# Patient Record
Sex: Female | Born: 2005 | Race: Black or African American | Hispanic: No | Marital: Single | Smoking: Former smoker
Health system: Southern US, Community
[De-identification: ages and names within clinical notes are randomized; demographics above are authoritative.]

## PROBLEM LIST (undated history)

## (undated) DIAGNOSIS — A048 Other specified bacterial intestinal infections: Secondary | ICD-10-CM

## (undated) DIAGNOSIS — R112 Nausea with vomiting, unspecified: Secondary | ICD-10-CM

## (undated) HISTORY — PX: CHOLECYSTECTOMY: SHX55

---
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
## 2023-01-09 ENCOUNTER — Ambulatory Visit: Payer: Medicaid Other | Admitting: Student

## 2023-01-09 ENCOUNTER — Emergency Department (HOSPITAL_COMMUNITY)
Admission: EM | Admit: 2023-01-09 | Discharge: 2023-01-09 | Disposition: A | Discharge status: Home / Self Care | Payer: Medicaid Other | Attending: Emergency Medicine | Admitting: Emergency Medicine

## 2023-01-09 DIAGNOSIS — R112 Nausea with vomiting, unspecified: Secondary | ICD-10-CM | POA: Diagnosis not present

## 2023-01-09 DIAGNOSIS — R1110 Vomiting, unspecified: Secondary | ICD-10-CM | POA: Diagnosis present

## 2023-01-09 DIAGNOSIS — R1013 Epigastric pain: Secondary | ICD-10-CM | POA: Insufficient documentation

## 2023-01-09 LAB — COMPREHENSIVE METABOLIC PANEL
ALT: 13 U/L (ref 0–44)
AST: 20 U/L (ref 15–41)
Albumin: 4.8 g/dL (ref 3.5–5.0)
Alkaline Phosphatase: 53 U/L (ref 38–126)
Anion gap: 14 (ref 5–15)
BUN: 6 mg/dL (ref 6–20)
CO2: 22 mmol/L (ref 22–32)
Calcium: 9.5 mg/dL (ref 8.9–10.3)
Chloride: 103 mmol/L (ref 98–111)
Creatinine, Ser: 0.58 mg/dL (ref 0.44–1.00)
GFR, Estimated: >60 mL/min (ref >60)
Glucose, Bld: 134 mg/dL — ABNORMAL HIGH (ref 70–99)
Potassium: 3.4 mmol/L — ABNORMAL LOW (ref 3.5–5.1)
Sodium: 139 mmol/L (ref 135–145)
Total Bilirubin: 0.6 mg/dL (ref 0.3–1.2)
Total Protein: 8.6 g/dL — ABNORMAL HIGH (ref 6.5–8.1)

## 2023-01-09 LAB — LIPASE, BLOOD: Lipase: 20 U/L (ref 11–51)

## 2023-01-09 LAB — PREGNANCY, URINE: Preg Test, Ur: NEGATIVE

## 2023-01-09 LAB — CBC WITH DIFFERENTIAL/PLATELET
Abs Immature Granulocytes: 0.03 10*3/uL (ref 0.00–0.07)
Basophils Absolute: 0 10*3/uL (ref 0.0–0.1)
Basophils Relative: 0 %
Eosinophils Absolute: 0 10*3/uL (ref 0.0–0.5)
Eosinophils Relative: 0 %
HCT: 44.1 % (ref 36.0–46.0)
Hemoglobin: 14.4 g/dL (ref 12.0–15.0)
Immature Granulocytes: 0 %
Lymphocytes Relative: 7 %
Lymphs Abs: 0.8 10*3/uL (ref 0.7–4.0)
MCH: 30.3 pg (ref 26.0–34.0)
MCHC: 32.7 g/dL (ref 30.0–36.0)
MCV: 92.8 fL (ref 80.0–100.0)
Monocytes Absolute: 0.4 10*3/uL (ref 0.1–1.0)
Monocytes Relative: 4 %
Neutro Abs: 9.5 10*3/uL — ABNORMAL HIGH (ref 1.7–7.7)
Neutrophils Relative %: 89 %
Platelets: 419 10*3/uL — ABNORMAL HIGH (ref 150–400)
RBC: 4.75 MIL/uL (ref 3.87–5.11)
RDW: 15.9 % — ABNORMAL HIGH (ref 11.5–15.5)
WBC: 10.8 10*3/uL — ABNORMAL HIGH (ref 4.0–10.5)
nRBC: 0 % (ref 0.0–0.2)

## 2023-01-09 LAB — URINALYSIS, ROUTINE W REFLEX MICROSCOPIC
Bilirubin Urine: NEGATIVE
Glucose, UA: NEGATIVE mg/dL
Hgb urine dipstick: NEGATIVE
Ketones, ur: 80 mg/dL — AB
Leukocytes,Ua: NEGATIVE
Nitrite: NEGATIVE
Protein, ur: 100 mg/dL — AB
Specific Gravity, Urine: 1.023 (ref 1.005–1.030)
pH: 8 (ref 5.0–8.0)

## 2023-01-09 MED ORDER — ONDANSETRON 4 MG PO TBDP: 4.0000 mg | ORAL_TABLET | Freq: Three times a day (TID) | ORAL | 0 refills | Status: DC | PRN

## 2023-01-09 MED ORDER — ONDANSETRON HCL 4 MG/2ML IJ SOLN
4.0000 mg | Freq: Once | INTRAMUSCULAR | Status: AC
  Administered 2023-01-09: 4 mg via INTRAVENOUS
  Filled 2023-01-09: qty 2

## 2023-01-09 MED ORDER — ONDANSETRON 4 MG PO TBDP: 4.0000 mg | ORAL_TABLET | Freq: Once | ORAL | Status: DC

## 2023-01-09 MED ORDER — LACTATED RINGERS IV BOLUS
1000.0000 mL | Freq: Once | INTRAVENOUS | Status: AC
  Administered 2023-01-09: 1000 mL via INTRAVENOUS

## 2023-01-09 MED ORDER — DROPERIDOL 2.5 MG/ML IJ SOLN
1.2500 mg | Freq: Once | INTRAMUSCULAR | Status: AC
  Administered 2023-01-09: 1.25 mg via INTRAVENOUS
  Filled 2023-01-09: qty 2

## 2023-01-09 MED ORDER — DROPERIDOL 2.5 MG/ML IJ SOLN: 1.2500 mg | Freq: Once | INTRAMUSCULAR | Status: DC

## 2023-01-09 MED ORDER — FENTANYL CITRATE PF 50 MCG/ML IJ SOSY
25.0000 ug | PREFILLED_SYRINGE | Freq: Once | INTRAMUSCULAR | Status: AC
  Administered 2023-01-09: 25 ug via INTRAVENOUS
  Filled 2023-01-09: qty 1

## 2023-01-09 NOTE — ED Provider Triage Note (Signed)
Emergency Medicine Provider Triage Evaluation Note  Najma RYANNAH BROOMELL , a 17 y.o. adult  was evaluated in triage.  Pt complains of vomiting  Review of Systems  Positive: Vomiting and abdominal pain  Negative: fever  Physical Exam  BP (!) 153/114 (BP Location: Right Arm)   Pulse (!) 115   Temp 98.3 F (36.8 C) (Oral)   Resp 16   Ht 5\' 3"  (1.6 m)   Wt 47.6 kg   LMP 12/10/2022   SpO2 99%   BMI 18.60 kg/m  Gen:   Awake, vomiting Resp:  Normal effort  MSK:   Moves extremities without difficulty  Other:    Medical Decision Making  Medically screening exam initiated at 12:22 PM.  Appropriate orders placed.  Jari E Kennemer was informed that the remainder of the evaluation will be completed by another provider, this initial triage assessment does not replace that evaluation, and the importance of remaining in the ED until their evaluation is complete.     Elson Areas, [place] 01/09/23 1223

## 2023-01-09 NOTE — Discharge Instructions (Addendum)
You are seen here in the emergency department for concerns of nausea and vomiting.  You had a positive response to medications here in the ER and appear stable for discharge. I have sent a prescription for Zofran to your pharmacy for continued nausea control at home. If your symptoms worsen, please return to the ER.

## 2023-01-09 NOTE — Progress Notes (Deleted)
    SUBJECTIVE:   CHIEF COMPLAINT / HPI:   ***  PERTINENT  PMH / PSH: ***  OBJECTIVE:   LMP 12/10/2022  ***  General: NAD, pleasant, able to participate in exam Cardiac: RRR, no murmurs. Respiratory: CTAB, normal effort, No wheezes, rales or rhonchi Abdomen: Bowel sounds present, nontender, nondistended, no hepatosplenomegaly. Extremities: no edema or cyanosis. Skin: warm and dry, no rashes noted Neuro: alert, no obvious focal deficits Psych: Normal affect and mood  ASSESSMENT/PLAN:   No problem-specific Assessment & Plan notes found for this encounter.     Dr. [name], DO [facility]    {    This will disappear when note is signed, click to select method of visit    :1}

## 2023-01-09 NOTE — ED Triage Notes (Signed)
Pt arrived POV reporting abdominal pain n/v x2days. Seen with similar symptoms in the past. NO other symptoms

## 2023-01-09 NOTE — ED Notes (Signed)
Unable to get urine sample

## 2023-01-09 NOTE — ED Provider Notes (Signed)
[place] EMERGENCY DEPARTMENT AT [facility] Provider Note   CSN: [id number] Arrival date & time: 01/09/23  1137     History Chief Complaint  Patient presents with   Emesis    Theresa Burnett is a 17 y.o. adult.  Patient without significant past medical history presents the emergency department complaints abdominal pain and vomiting.  Reports has been ongoing for approximately 2 days.  Has been seeing in the past in the emergency department similar symptoms.  Denies any other concerns such as diarrhea, chest pain, shortness of breath, headache, fevers.  No urinary concerns at this time.  Denies any chance of pregnancy.  Denies any drug or substance use.  Was last seen on 12/24/2022 cannabinol hyperemesis syndrome.  HPI     Home Medications Prior to Admission medications   Medication Sig Start Date End Date Taking? Authorizing Provider  ondansetron (ZOFRAN-ODT) 4 MG disintegrating tablet Take 1 tablet (4 mg total) by mouth every 8 (eight) hours as needed for nausea or vomiting. 01/09/23  Yes Jw Covin A, PA-C  calcium carbonate (TUMS - DOSED IN MG ELEMENTAL CALCIUM) 500 MG chewable tablet Chew 5 tablets (1,000 mg of elemental calcium total) by mouth 2 (two) times daily. Patient not taking: Reported on 12/14/2022 05/03/22   [name], DO  dicyclomine (BENTYL) 20 MG tablet Take 1 tablet (20 mg total) by mouth 2 (two) times daily. Patient not taking: Reported on 12/14/2022 07/25/22   Prosperi, Christian H, PA-C  metoCLOPramide (REGLAN) 10 MG tablet Take 1 tablet (10 mg total) by mouth every 8 (eight) hours as needed for nausea. Patient not taking: Reported on 12/14/2022 04/27/22 04/27/23  Minna Antis, MD  mirtazapine (REMERON) 7.5 MG tablet TAKE 1 TABLET BY MOUTH AT BEDTIME. Patient not taking: Reported on 12/14/2022 05/29/22   [name], DO  Multiple Vitamin (MULTIVITAMIN WITH MINERALS) TABS tablet Take 1 tablet by mouth daily. 12/14/22   [name], DO  ondansetron  (ZOFRAN) 4 MG tablet Take 1 tablet (4 mg total) by mouth every 6 (six) hours as needed for nausea or vomiting. 12/24/22   Al Decant, PA-C  ondansetron (ZOFRAN-ODT) 4 MG disintegrating tablet Take 1 tablet (4 mg total) by mouth every 8 (eight) hours as needed for nausea or vomiting. 12/14/22   [name], DO  pantoprazole (PROTONIX) 40 MG tablet TAKE 1 TABLET BY MOUTH DAILY AS NEEDED 12/21/22   [name], DO  potassium chloride SA (KLOR-CON M) 20 MEQ tablet Take 1 tablet (20 mEq total) by mouth 2 (two) times daily. 12/24/22   Al Decant, PA-C  promethazine (PHENERGAN) 25 MG suppository Place 1 suppository (25 mg total) rectally every 6 (six) hours as needed for nausea or vomiting. 12/14/22   [name], DO  sucralfate (CARAFATE) 1 g tablet Take 1 tablet (1 g total) by mouth 4 (four) times daily. Patient not taking: Reported on 12/14/2022 07/24/22   Lorre Nick, MD      Allergies    Patient has no known allergies.    Review of Systems   Review of Systems  Gastrointestinal:  Positive for nausea and vomiting.  All other systems reviewed and are negative.   Physical Exam Updated Vital Signs BP (!) 162/98 (BP Location: Left Arm)   Pulse 71   Temp 97.7 F (36.5 C) (Oral)   Resp 18   Ht 5\' 3"  (1.6 m)   Wt 47.6 kg   LMP 12/10/2022   SpO2 100%   BMI 18.60 kg/m  Physical Exam Vitals and nursing note reviewed.  Constitutional:      General: Theresa Burnett is not in acute distress.    Appearance: Theresa Burnett is well-developed.  HENT:     Head: Normocephalic and atraumatic.  Eyes:     Conjunctiva/sclera: Conjunctivae normal.  Cardiovascular:     Rate and Rhythm: Normal rate and regular rhythm.     Heart sounds: No murmur heard. Pulmonary:     Effort: Pulmonary effort is normal. No respiratory distress.     Breath sounds: Normal breath sounds.  Abdominal:     General: There is no distension.     Palpations: Abdomen is soft. There is no mass.     Tenderness: There is  abdominal tenderness in the epigastric area.  Musculoskeletal:        General: No swelling.     Cervical back: Neck supple.  Skin:    General: Skin is warm and dry.     Capillary Refill: Capillary refill takes less than 2 seconds.  Neurological:     Mental Status: Theresa Burnett is alert.  Psychiatric:        Mood and Affect: Mood normal.     ED Results / Procedures / Treatments   Labs (all labs ordered are listed, but only abnormal results are displayed) Labs Reviewed  CBC WITH DIFFERENTIAL/PLATELET - Abnormal; Notable for the following components:      Result Value   WBC 10.8 (*)    RDW 15.9 (*)    Platelets 419 (*)    Neutro Abs 9.5 (*)    All other components within normal limits  COMPREHENSIVE METABOLIC PANEL - Abnormal; Notable for the following components:   Potassium 3.4 (*)    Glucose, Bld 134 (*)    Total Protein 8.6 (*)    All other components within normal limits  LIPASE, BLOOD  URINALYSIS, ROUTINE W REFLEX MICROSCOPIC  PREGNANCY, URINE    EKG None  Radiology No results found.  Procedures Procedures   Medications Ordered in ED Medications  lactated ringers bolus 1,000 mL (0 mLs Intravenous Stopped 01/09/23 1634)  ondansetron (ZOFRAN) injection 4 mg (4 mg Intravenous Given 01/09/23 1439)  fentaNYL (SUBLIMAZE) injection 25 mcg (25 mcg Intravenous Given 01/09/23 1440)  droperidol (INAPSINE) 2.5 MG/ML injection 1.25 mg (1.25 mg Intravenous Given 01/09/23 1603)    ED Course/ Medical Decision Making/ A&P                               Medical Decision Making Risk Prescription drug management.   This patient presents to the ED for concern of abdominal pain, emesis.  Differential diagnosis includes cannabinoid hyperemesis syndrome, gastroenteritis, bowel obstruction, pancreatitis, pyelonephritis   Lab Tests:  I Ordered, and personally interpreted labs.  The pertinent results include: Mild leukocytosis of 10.8, platelets elevated at 419 which is somewhat now  patient's baseline compared to prior labs possibly secondary to dehydration, CMP with mild hypokalemia at 3.4 likely due to GI loss, lipase normal at 20, no evidence of an anion gap acidosis   Medicines ordered and prescription drug management:  I ordered medication including fluids, fentanyl, Zofran, droperidol for irritation, pain, nausea, cannabinoid induced vomiting Reevaluation of the patient after these medicines showed that the patient was I have reviewed the patients home medicines and have made adjustments as needed   Problem List / ED Course:  Patient with past history significant for cyclic vomiting syndrome, 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  States that this is been ongoing for the last 2 days.  Denies any improvement in symptoms.  No concern for pregnancy at this time.  No other concerning symptoms such as diarrhea, chest pain, shortness of breath, headache or dizziness.  Basic labs ordered from triage for evaluation of patient's symptoms including signs of infection or dehydration. CBC with mild leukocytosis at 10.8 and mild thrombocytosis at 419.  CMP without any obvious electrolyte derangements potassium slightly decreased at 3.4 better than last visit in the ED.  Lipase is 28 and negative, UA and urine pregnancy pending. Patient denies any urinary symptoms or chance of pregnancy. Patient tried a course of fentanyl and Zofran with only minimal improvement in symptoms.  No significant improvement in symptoms with this.  Although patient reports no recent cannabis use, droperidol attempted for improvement in symptoms. Patient medically significant improvement in symptoms after droperidol administration.  Will plan on discharging patient home. Discussed return precautions.  Patient otherwise stable for discharge home.   Final Clinical  Impression(s) / ED Diagnoses Final diagnoses:  Nausea and vomiting, unspecified vomiting type    Rx / DC Orders ED Discharge Orders          Ordered    ondansetron (ZOFRAN-ODT) 4 MG disintegrating tablet  Every 8 hours PRN        01/09/23 1738              Smitty Knudsen, PA-C 01/09/23 1741    Linwood Dibbles, MD 01/10/23 1350

## 2023-01-15 ENCOUNTER — Emergency Department (HOSPITAL_COMMUNITY)
Admission: EM | Admit: 2023-01-15 | Discharge: 2023-01-15 | Discharge status: Against Medical Advice | Payer: Medicaid Other | Attending: Emergency Medicine | Admitting: Emergency Medicine

## 2023-01-15 DIAGNOSIS — R112 Nausea with vomiting, unspecified: Secondary | ICD-10-CM | POA: Insufficient documentation

## 2023-01-15 DIAGNOSIS — Z5321 Procedure and treatment not carried out due to patient leaving prior to being seen by health care provider: Secondary | ICD-10-CM | POA: Insufficient documentation

## 2023-01-15 LAB — COMPREHENSIVE METABOLIC PANEL
ALT: 13 U/L (ref 0–44)
AST: 19 U/L (ref 15–41)
Albumin: 4.6 g/dL (ref 3.5–5.0)
Alkaline Phosphatase: 52 U/L (ref 38–126)
Anion gap: 14 (ref 5–15)
BUN: 7 mg/dL (ref 6–20)
CO2: 29 mmol/L (ref 22–32)
Calcium: 9.7 mg/dL (ref 8.9–10.3)
Chloride: 93 mmol/L — ABNORMAL LOW (ref 98–111)
Creatinine, Ser: 0.66 mg/dL (ref 0.44–1.00)
GFR, Estimated: >60 mL/min (ref >60)
Glucose, Bld: 106 mg/dL — ABNORMAL HIGH (ref 70–99)
Potassium: 2.5 mmol/L — CL (ref 3.5–5.1)
Sodium: 136 mmol/L (ref 135–145)
Total Bilirubin: 1.2 mg/dL (ref 0.3–1.2)
Total Protein: 8.5 g/dL — ABNORMAL HIGH (ref 6.5–8.1)

## 2023-01-15 LAB — CBC
HCT: 44.2 % (ref 36.0–46.0)
Hemoglobin: 15.1 g/dL — ABNORMAL HIGH (ref 12.0–15.0)
MCH: 30.6 pg (ref 26.0–34.0)
MCHC: 34.2 g/dL (ref 30.0–36.0)
MCV: 89.5 fL (ref 80.0–100.0)
Platelets: 278 10*3/uL (ref 150–400)
RBC: 4.94 MIL/uL (ref 3.87–5.11)
RDW: 15.3 % (ref 11.5–15.5)
WBC: 7.5 10*3/uL (ref 4.0–10.5)
nRBC: 0 % (ref 0.0–0.2)

## 2023-01-15 LAB — HCG, SERUM, QUALITATIVE: Preg, Serum: NEGATIVE

## 2023-01-15 LAB — LIPASE, BLOOD: Lipase: 23 U/L (ref 11–51)

## 2023-01-15 NOTE — ED Triage Notes (Signed)
Pt BIBA from home for NV x4 days, getting worse the last few days. Hx of same. Liquidy emesis.   138/92 HR 100 99% RA CBG 158

## 2023-01-15 NOTE — ED Notes (Signed)
Pt not in waiting room pt phone called and she did not answer. Pt eloped

## 2023-02-18 ENCOUNTER — Emergency Department (HOSPITAL_COMMUNITY)
Admission: EM | Admit: 2023-02-18 | Discharge: 2023-02-18 | Disposition: A | Discharge status: Home / Self Care | Payer: Medicaid Other | Attending: Emergency Medicine | Admitting: Emergency Medicine

## 2023-02-18 ENCOUNTER — Emergency Department (HOSPITAL_COMMUNITY): Payer: Medicaid Other

## 2023-02-18 ENCOUNTER — Encounter (HOSPITAL_COMMUNITY): Payer: Self-pay

## 2023-02-18 DIAGNOSIS — R1084 Generalized abdominal pain: Secondary | ICD-10-CM

## 2023-02-18 DIAGNOSIS — N8301 Follicular cyst of right ovary: Secondary | ICD-10-CM | POA: Diagnosis not present

## 2023-02-18 DIAGNOSIS — N83201 Unspecified ovarian cyst, right side: Secondary | ICD-10-CM

## 2023-02-18 LAB — COMPREHENSIVE METABOLIC PANEL
ALT: 15 U/L (ref 0–44)
AST: 23 U/L (ref 15–41)
Albumin: 5 g/dL (ref 3.5–5.0)
Alkaline Phosphatase: 58 U/L (ref 38–126)
Anion gap: 11 (ref 5–15)
BUN: 6 mg/dL (ref 6–20)
CO2: 21 mmol/L — ABNORMAL LOW (ref 22–32)
Calcium: 9.8 mg/dL (ref 8.9–10.3)
Chloride: 105 mmol/L (ref 98–111)
Creatinine, Ser: 0.58 mg/dL (ref 0.44–1.00)
GFR, Estimated: >60 mL/min (ref >60)
Glucose, Bld: 119 mg/dL — ABNORMAL HIGH (ref 70–99)
Potassium: 3.3 mmol/L — ABNORMAL LOW (ref 3.5–5.1)
Sodium: 137 mmol/L (ref 135–145)
Total Bilirubin: 0.9 mg/dL (ref 0.3–1.2)
Total Protein: 9 g/dL — ABNORMAL HIGH (ref 6.5–8.1)

## 2023-02-18 LAB — CBC WITH DIFFERENTIAL/PLATELET
Abs Immature Granulocytes: 0.03 10*3/uL (ref 0.00–0.07)
Basophils Absolute: 0 10*3/uL (ref 0.0–0.1)
Basophils Relative: 0 %
Eosinophils Absolute: 0 10*3/uL (ref 0.0–0.5)
Eosinophils Relative: 0 %
HCT: 46.1 % — ABNORMAL HIGH (ref 36.0–46.0)
Hemoglobin: 15.3 g/dL — ABNORMAL HIGH (ref 12.0–15.0)
Immature Granulocytes: 0 %
Lymphocytes Relative: 14 %
Lymphs Abs: 1.3 10*3/uL (ref 0.7–4.0)
MCH: 30.8 pg (ref 26.0–34.0)
MCHC: 33.2 g/dL (ref 30.0–36.0)
MCV: 92.9 fL (ref 80.0–100.0)
Monocytes Absolute: 0.4 10*3/uL (ref 0.1–1.0)
Monocytes Relative: 4 %
Neutro Abs: 7.6 10*3/uL (ref 1.7–7.7)
Neutrophils Relative %: 82 %
Platelets: 393 10*3/uL (ref 150–400)
RBC: 4.96 MIL/uL (ref 3.87–5.11)
RDW: 15.9 % — ABNORMAL HIGH (ref 11.5–15.5)
WBC: 9.3 10*3/uL (ref 4.0–10.5)
nRBC: 0 % (ref 0.0–0.2)

## 2023-02-18 LAB — LIPASE, BLOOD: Lipase: 26 U/L (ref 11–51)

## 2023-02-18 LAB — HCG, SERUM, QUALITATIVE: Preg, Serum: NEGATIVE

## 2023-02-18 MED ORDER — OXYCODONE HCL 5 MG PO TABS: 5.0000 mg | ORAL_TABLET | Freq: Four times a day (QID) | ORAL | 0 refills | Status: AC | PRN

## 2023-02-18 MED ORDER — IOHEXOL 300 MG/ML  SOLN
100.0000 mL | Freq: Once | INTRAMUSCULAR | Status: AC | PRN
  Administered 2023-02-18: 100 mL via INTRAVENOUS

## 2023-02-18 MED ORDER — ACETAMINOPHEN 325 MG PO TABS
650.0000 mg | ORAL_TABLET | Freq: Once | ORAL | Status: DC
  Filled 2023-02-18: qty 2

## 2023-02-18 MED ORDER — ONDANSETRON HCL 4 MG/2ML IJ SOLN
4.0000 mg | Freq: Once | INTRAMUSCULAR | Status: AC
  Administered 2023-02-18: 4 mg via INTRAVENOUS
  Filled 2023-02-18: qty 2

## 2023-02-18 MED ORDER — METOCLOPRAMIDE HCL 5 MG/ML IJ SOLN
10.0000 mg | Freq: Once | INTRAMUSCULAR | Status: AC
  Administered 2023-02-18: 10 mg via INTRAVENOUS
  Filled 2023-02-18: qty 2

## 2023-02-18 MED ORDER — ONDANSETRON 4 MG PO TBDP: 4.0000 mg | ORAL_TABLET | Freq: Three times a day (TID) | ORAL | 0 refills | Status: DC | PRN

## 2023-02-18 MED ORDER — MORPHINE SULFATE (PF) 4 MG/ML IV SOLN
4.0000 mg | Freq: Once | INTRAVENOUS | Status: AC
  Administered 2023-02-18: 4 mg via INTRAVENOUS
  Filled 2023-02-18: qty 1

## 2023-02-18 NOTE — Discharge Instructions (Signed)
Thank you for coming to [facility] Emergency Department. You were seen for abdominal pain. We did an exam, labs, and imaging, and these showed a small right ovarian cyst with some free fluid in the pelvis. It is possible that you had a cyst rupture today. You can alternate taking Tylenol and ibuprofen as needed for pain. You can take 650mg  tylenol (acetaminophen) every 4-6 hours, and 600 mg ibuprofen 3 times a day. Please follow up with your gynecologist in 1-2 weeks.    Do not hesitate to return to the ED or call 911 if you experience: -Worsening symptoms -Lightheadedness, passing out -Fevers/chills -Anything else that concerns you

## 2023-02-18 NOTE — ED Provider Notes (Signed)
[place] EMERGENCY DEPARTMENT AT [facility] Provider Note   CSN: [id number] Arrival date & time: 02/18/23  1112     History  Chief Complaint  Patient presents with   Abdominal Pain    Theresa Burnett is a 17 y.o. adult.  HPI   17 year old female presents emergency department with concern for mid abdominal pain.  This has been going on for the past 2 days.  History primarily obtained from the mother at bedside, patient is uncomfortable in the bed but is not participating in history giving.  Mother states that the abdominal pain started about 2 days ago, is mid abdominal, cramping and sharp.  The pain is so severe that this is what causes the nausea/vomiting.  No noted fever, diarrhea, genitourinary symptoms.  Last menstrual period was last week and reported to be normal.  Home Medications Prior to Admission medications   Medication Sig Start Date End Date Taking? Authorizing Provider  calcium carbonate (TUMS - DOSED IN MG ELEMENTAL CALCIUM) 500 MG chewable tablet Chew 5 tablets (1,000 mg of elemental calcium total) by mouth 2 (two) times daily. Patient not taking: Reported on 12/14/2022 05/03/22   [name], DO  dicyclomine (BENTYL) 20 MG tablet Take 1 tablet (20 mg total) by mouth 2 (two) times daily. Patient not taking: Reported on 12/14/2022 07/25/22   Prosperi, Christian H, PA-C  metoCLOPramide (REGLAN) 10 MG tablet Take 1 tablet (10 mg total) by mouth every 8 (eight) hours as needed for nausea. Patient not taking: Reported on 12/14/2022 04/27/22 04/27/23  Minna Antis, MD  mirtazapine (REMERON) 7.5 MG tablet TAKE 1 TABLET BY MOUTH AT BEDTIME. Patient not taking: Reported on 12/14/2022 05/29/22   [name], DO  Multiple Vitamin (MULTIVITAMIN WITH MINERALS) TABS tablet Take 1 tablet by mouth daily. 12/14/22   [name], DO  ondansetron (ZOFRAN) 4 MG tablet Take 1 tablet (4 mg total) by mouth every 6 (six) hours as needed for nausea or vomiting. 12/24/22   Al Decant, PA-C  ondansetron (ZOFRAN-ODT) 4 MG disintegrating tablet Take 1 tablet (4 mg total) by mouth every 8 (eight) hours as needed for nausea or vomiting. 12/14/22   [name], DO  ondansetron (ZOFRAN-ODT) 4 MG disintegrating tablet Take 1 tablet (4 mg total) by mouth every 8 (eight) hours as needed for nausea or vomiting. 01/09/23   Smitty Knudsen, PA-C  pantoprazole (PROTONIX) 40 MG tablet TAKE 1 TABLET BY MOUTH DAILY AS NEEDED 12/21/22   [name], DO  potassium chloride SA (KLOR-CON M) 20 MEQ tablet Take 1 tablet (20 mEq total) by mouth 2 (two) times daily. 12/24/22   Al Decant, PA-C  promethazine (PHENERGAN) 25 MG suppository Place 1 suppository (25 mg total) rectally every 6 (six) hours as needed for nausea or vomiting. 12/14/22   [name], DO  sucralfate (CARAFATE) 1 g tablet Take 1 tablet (1 g total) by mouth 4 (four) times daily. Patient not taking: Reported on 12/14/2022 07/24/22   Lorre Nick, MD      Allergies    Patient has no known allergies.    Review of Systems   Review of Systems  Constitutional:  Negative for fever.  Respiratory:  Negative for shortness of breath.   Cardiovascular:  Negative for chest pain.  Gastrointestinal:  Positive for abdominal pain, nausea and vomiting. Negative for diarrhea.  Genitourinary:  Negative for difficulty urinating, menstrual problem and pelvic pain.  Skin:  Negative for rash.  Neurological:  Negative for  headaches.    Physical Exam Updated Vital Signs BP (!) 150/96   Pulse 88   Temp 98.1 F (36.7 C) (Oral)   Resp 17   LMP 02/16/2023   SpO2 98%  Physical Exam Vitals and nursing note reviewed.  Constitutional:      General: Theresa Burnett is not in acute distress.    Appearance: Normal appearance.  HENT:     Head: Normocephalic.     Mouth/Throat:     Mouth: Mucous membranes are moist.  Cardiovascular:     Rate and Rhythm: Normal rate.  Pulmonary:     Effort: Pulmonary effort is normal. No respiratory  distress.  Abdominal:     General: Bowel sounds are normal. There is no distension.     Palpations: Abdomen is soft.     Tenderness: There is abdominal tenderness. There is no guarding or rebound.  Skin:    General: Skin is warm.  Neurological:     Mental Status: Theresa Burnett is alert and oriented to person, place, and time. Mental status is at baseline.  Psychiatric:        Mood and Affect: Mood normal.     ED Results / Procedures / Treatments   Labs (all labs ordered are listed, but only abnormal results are displayed) Labs Reviewed  CBC WITH DIFFERENTIAL/PLATELET - Abnormal; Notable for the following components:      Result Value   Hemoglobin 15.3 (*)    HCT 46.1 (*)    RDW 15.9 (*)    All other components within normal limits  COMPREHENSIVE METABOLIC PANEL - Abnormal; Notable for the following components:   Potassium 3.3 (*)    CO2 21 (*)    Glucose, Bld 119 (*)    Total Protein 9.0 (*)    All other components within normal limits  LIPASE, BLOOD  HCG, SERUM, QUALITATIVE    EKG None  Radiology No results found.  Procedures Procedures    Medications Ordered in ED Medications  acetaminophen (TYLENOL) tablet 650 mg (650 mg Oral Patient Refused/Not Given 02/18/23 1331)  morphine (PF) 4 MG/ML injection 4 mg (4 mg Intravenous Given 02/18/23 1345)  ondansetron (ZOFRAN) injection 4 mg (4 mg Intravenous Given 02/18/23 1345)    ED Course/ Medical Decision Making/ A&P                                 Medical Decision Making Amount and/or Complexity of Data Reviewed Labs: ordered. Radiology: ordered.  Risk OTC drugs. Prescription drug management.   17 year old female presents emergency department abdominal pain for 2 days, associated with nausea/vomiting.  They are denying any genitourinary symptoms, recent menstrual period was last week and normal for her.  Afebrile on arrival, uncomfortable and moaning in the bed with a tender abdomen on exam but not distended or  peritonitic.  Primary history obtained from the mother at bedside because the patient is not fully participating at this time.  Pregnancy test is negative.  Blood work is reassuring, IV medications ordered.  Plan for CT scan of the abdomen given the discomfort on exam.  Patient signed out pending CT imaging.        Final Clinical Impression(s) / ED Diagnoses Final diagnoses:  None    Rx / DC Orders ED Discharge Orders     None         Rozelle Logan, DO 02/18/23 1530

## 2023-02-18 NOTE — ED Provider Notes (Signed)
7:19 PM Assumed care of patient from off-going team. For more details, please see note from same day.  In brief, this is a 17 y.o. adult p/w abdominal pain, cramping/sharp x 2 days. So severe results in vomiting. No f/c, GU sxs, diarrhea/constipation.   Plan/Dispo at time of sign-out & ED Course since sign-out: [ ]  CT  BP (!) 150/96   Pulse 88   Temp 98.5 F (36.9 C) (Oral)   Resp 17   LMP 02/16/2023   SpO2 98%    ED Course:   Clinical Course as of 02/18/23 1919  Sun Feb 18, 2023  1621 CT ABDOMEN PELVIS W CONTRAST 1. No acute findings in the abdomen pelvis. 2. Normal appendix. 3. Postcholecystectomy. 4. Low-density cyst of the RIGHT ovary. Small volume of free fluid in the posterior cul-de-sac. Favor benign functional ovarian cyst or hemorrhagic cyst. No follow-up recommended unless pelvic pain.   [HN]  1622 Ordered US torsion r/o.  [HN]  1748 US PELVIS (TRANSABDOMINAL ONLY) 1. Simple, benign right ovarian cyst or follicle measuring 2.5 x 2.4 x 2.1 cm. No follow-up imaging recommended. 2. Otherwise normal pelvic ultrasound. Arterial and venous Doppler flow present to the ovaries.   [HN]  [id number] Patient with no signs of ovarian torsion on ultrasound.  She has small simple right ovarian cyst.  With a small mount of free fluid in her pelvis noted on the CT it is possible that she had a cyst rupture today causing her abdominal pain.  Patient feels improved and would like to be discharged. She and her mother state that she has been dealing with this pain intermittently for 2 years and have been evaluated bu thaven't found answers. They will call tomorrow to make appt with PCP. They request short course of pain medication. I do believe ovarian cyst rupture could be contributing to her acute pain and ovarian cysts could cause intermittent pain but they state that they have never been told that before on prior evaluations. Do still recommend f/u with gynecologist. I advised Tylenol and  ibuprofen for pain management and follow-up with a gynecologist within 1 to 2 weeks.  Patient reports understanding, all questions answered to patient satisfaction, discharged with discharge instructions and return precautions. [HN]    Clinical Course User Index [HN] Loetta Rough, MD    Dispo: DC ------------------------------- Vivi Barrack, MD Emergency Medicine  This note was created using dictation software, which may contain spelling or grammatical errors.   Loetta Rough, MD 02/18/23 [phone]

## 2023-02-18 NOTE — ED Triage Notes (Signed)
Pt presents with c/o abdominal pain for 2 days. Pt reports she has also had nausea and vomiting.

## 2023-03-06 ENCOUNTER — Encounter: Payer: Self-pay | Admitting: Student

## 2023-03-06 ENCOUNTER — Other Ambulatory Visit: Payer: Self-pay | Admitting: Student

## 2023-03-06 ENCOUNTER — Ambulatory Visit (INDEPENDENT_AMBULATORY_CARE_PROVIDER_SITE_OTHER): Payer: Medicaid Other | Admitting: Student

## 2023-03-06 VITALS — BP 91/64 | HR 74 | Ht 63.0 in | Wt 99.2 lb

## 2023-03-06 DIAGNOSIS — K5900 Constipation, unspecified: Secondary | ICD-10-CM

## 2023-03-06 DIAGNOSIS — R1115 Cyclical vomiting syndrome unrelated to migraine: Secondary | ICD-10-CM | POA: Diagnosis present

## 2023-03-06 MED ORDER — SENNA 8.6 MG PO TABS
1.0000 | ORAL_TABLET | Freq: Every day | ORAL | 0 refills | Status: DC | PRN
Start: 2023-03-06 — End: 2023-06-22

## 2023-03-06 MED ORDER — AMITRIPTYLINE HCL 25 MG PO TABS
25.0000 mg | ORAL_TABLET | Freq: Every day | ORAL | 0 refills | Status: DC
Start: 2023-03-06 — End: 2023-04-02

## 2023-03-06 MED ORDER — LACTULOSE 10 G PO PACK
10.0000 g | PACK | Freq: Every day | ORAL | 0 refills | Status: DC | PRN
Start: 2023-03-06 — End: 2023-03-07

## 2023-03-06 MED ORDER — GLYCERIN (ADULT) 2 G RE SUPP: 1.0000 | RECTAL | 0 refills | Status: DC | PRN

## 2023-03-06 NOTE — Progress Notes (Unsigned)
    SUBJECTIVE:   CHIEF COMPLAINT / HPI:   Cyclic vomiting syndrome-cannabinoid hyperemesis syndrome Has been seen in ED 3 times since our last visit for abdominal pain and nausea and vomiting. CMP from 02/18/2023 showed mild hypokalemia at 3.3, otherwise non concerning. Since then, has vomited on occasion, not daily. Over eating sometimes causes vomiting.  She is still smoking marijuana daily. Wants to stop but having a very difficult time.   Constipation Has been battling with constipation for a while   PERTINENT  PMH / PSH: ***  OBJECTIVE:   BP 91/64   Pulse 74   Ht 5\' 3"  (1.6 m)   Wt 99 lb 3.2 oz (45 kg)   LMP 02/16/2023   SpO2 98%   BMI 17.57 kg/m    General: NAD, pleasant, able to participate in exam Cardiac: RRR, no murmurs. Respiratory: CTAB, normal effort, No wheezes, rales or rhonchi Abdomen: Bowel sounds present, nontender, nondistended, no hepatosplenomegaly. Extremities: no edema or cyanosis. Skin: warm and dry, no rashes noted Neuro: alert, no obvious focal deficits Psych: Normal affect and mood  ASSESSMENT/PLAN:   No problem-specific Assessment & Plan notes found for this encounter.     Prophylaxis - tricyclics   Dr. [name], DO [facility]    {    This will disappear when note is signed, click to select method of visit    :1}

## 2023-03-06 NOTE — Patient Instructions (Addendum)
Prescriptions sent to pharmacy for constipation: Lactulose and senna are oral medications you can take as needed.  Lactulose helps soften the stools while the senna will help stimulate a bowel movement.  If he cannot tolerate oral medications, I sent in glycerin suppositories.  I sent in a prescription for amitriptyline.  This medication will hopefully help lessen the episodes of vomiting and abdominal pain and may also help with anxiety and mood.  I sent a referral for a psychiatrist.  If you are not contacted to schedule appointment within a few weeks, let me know.  I recommend going to psychology today.com to look for therapist in your area  Return in about a month for follow-up    Therapy and Counseling Resources Most providers on this list will take Medicaid. Patients with commercial insurance or Medicare should contact their insurance company to get a list of in network providers.  [facility] (spanish speaking therapist available)(habla espanol)(take medicare and medicaid)  [address], [place], [place] [address], [email or web address] [email or web address] [phone]  [facility]Psychiatry and Counseling [address] [place], [place] [address] [phone]  [facility]   [address], Suite [place], [place] [address]      [phone]  [facility] (spanish available) [address]  [place], [place] [address] [phone]  [facility] (take [facility] and medicare) [address], [address]  [place], [place] [address]       [phone]     [facility] (virtual only) [phone]  [facility]ov[facility]uss[facility] [address] [place], [place] [phone]  [facility]:  [address] [place] [phone]  [facility]:  [address] [place] Diener [phone]  [facility] (under & uninsured) [address]   [place] [phone]     [email or web address]    [facility] [address]  [place]    [phone]  [facility] -[address]     Phone:  [phone]     [facility]-  [address]  [phone]   [facility] [address]      [place], [place] [phone]  [facility]: [address] [place], [place], [place]  [phone]   [facility] (Spanish therapist) [email or web address]  [address] [place]  [address]   [place] [address]    [phone]    [facility]   [address],  [place], [place]  [phone]   [facility]  [address] [place]  [phone]  [facility]  [address] [place], [place]        [phone]  Open Access/Walk In Clinic under & uninsured  [facility]  [address] [place], [place] [phone] Crisis [phone]  [facility] [place],  (Spanish)   [address], [place]: [phone]) 8:30 - 12; 1 - 2:30  [facility] [employer],  [address] [place], [place]    [phone]):8:30 - 12; 2 - 3PM  [facility] [employer],  [address],  [place]; [phone]):   Mon - Fri 8 AM - 5 PM  [facility] [address] [place]  MWF 12:30 to 3:00 or call to schedule an appointment  [phone]  Specific Provider options Psychology Today  [email or web address]/us click on find a therapist  enter your zip code left side and select or tailor a therapist for your specific need.   [facility] Provider Directory [email or web address]  (Medicaid)   Follow all drop down to find a provider  Social Support program [facility] [phone] or PhotoSolver.pl [address], [place], [place] Recovery support and educational   24- Hour  Availability:   [facility]  [address] [place], [place] [phone] Crisis [phone]  [facility] [phone]  [facility]  [phone]   [facility]  [phone] (after hours)  Therapeutic Alternative/Mobile Crisis   [phone]  [place] National Suicide Hotline  [phone] Len Childs)  Call 911 or go to emergency room  [facility]  [phone]);  [place] and [employer]  [phone]); [place], [place], [place], [employer], [place], [place], [place]

## 2023-03-07 NOTE — Assessment & Plan Note (Signed)
Likely exacerbated by cannabis use.  She is expressing readiness to make changes and establish care with therapist and psychiatrist.  Discussed getting labs today to monitor her electrolytes and hemoglobin.  Patient declines at this time which is reasonable, can consider repeating labs at next visit. -Therapy resources provided, we also went through psychology today.com together -Referral to psychiatry placed -Rx amitriptyline 25 mg at bedtime for cyclical vomiting prophylaxis and mood.  If tolerating well after 2 weeks, can increase to 50 mg at bedtime if needed -Follow-up in 1 month

## 2023-03-07 NOTE — Assessment & Plan Note (Signed)
Rx lactulose 10g to 20g (less volume to swallow compared to MiraLAX) and senna 8.6 mg tabs to use daily as needed, and glycerin suppositories when she cannot tolerate p.o.

## 2023-03-16 ENCOUNTER — Encounter: Payer: Self-pay | Admitting: Student

## 2023-03-16 ENCOUNTER — Telehealth: Payer: Self-pay | Admitting: Student

## 2023-03-16 NOTE — Telephone Encounter (Signed)
Called patient to check-in on how she is doing on new medications prescribed at last visit.  Left HIPAA compliant voicemail for patient to return call if able.  Plan to offer admission for failure to thrive as her weight has not been this low since February and she has lost 6 pounds in the last 2 months.  Reassuringly, labs last month were stable.  Will discuss further if I can get her on the phone or at next visit.  Will also send MyChart message.

## 2023-03-21 ENCOUNTER — Ambulatory Visit: Payer: Medicaid Other | Admitting: Family Medicine

## 2023-03-21 NOTE — Progress Notes (Deleted)
    SUBJECTIVE:   CHIEF COMPLAINT / HPI:   Weight check: needs labs also, if willing to talk today, could offer hospitalization for failure to thrive Can go up to 50mg  amytryptilline if needed  PERTINENT  PMH / PSH: Cyclical vomiting  OBJECTIVE:   There were no vitals taken for this visit.  ***  ASSESSMENT/PLAN:   No problem-specific Assessment & Plan notes found for this encounter.     Gerrit Heck, DO [facility]

## 2023-03-30 ENCOUNTER — Other Ambulatory Visit: Payer: Self-pay | Admitting: Student

## 2023-03-30 DIAGNOSIS — R1115 Cyclical vomiting syndrome unrelated to migraine: Secondary | ICD-10-CM

## 2023-04-02 ENCOUNTER — Other Ambulatory Visit: Payer: Self-pay | Admitting: Student

## 2023-04-02 DIAGNOSIS — R1115 Cyclical vomiting syndrome unrelated to migraine: Secondary | ICD-10-CM

## 2023-04-02 NOTE — Progress Notes (Signed)
    SUBJECTIVE:   CHIEF COMPLAINT / HPI:   Cyclic vomiting syndrome Started amitriptyline at visit, tolerating well. Now taking 50 mg at bedtime. States this has helped her symptoms significantly including the abdominal pain, vomiting, anxiety and depression.  Will now go a few days without vomiting. Has been able to eat regularly and keep the food for past few weeks. Snacks (crackers) in mornings/afternoons and a full dinner at night.  Had phone apt with psychiatry recently. She is unsure how to get in touch with the psychiatrist again to schedule next appointment but does want to continue therapy.   PERTINENT  PMH / PSH: cyclical vomiting, cannabis hyperemesis  OBJECTIVE:   BP 123/81   Pulse 73   Ht 5\' 3"  (1.6 m)   Wt 116 lb 9.6 oz (52.9 kg)   SpO2 96%   BMI 20.65 kg/m    General: NAD, pleasant, able to participate in exam Cardiac: RRR, no murmurs. Respiratory:  normal effort Abdomen: Bowel sounds present, nontender, nondistended, soft Skin: warm and dry Neuro: alert, no obvious focal deficits Psych: Normal affect and mood, smiling  ASSESSMENT/PLAN:   Cyclic vomiting syndrome Patient is doing significantly better on amitriptyline and is very happy with the results.  Weight has increased by 17 pounds since last month and her mood is noticeably improved -CMP, Phos, mag today for monitoring -Continue amitriptyline 50 mg at bedtime -Zofran refilled to use as needed -Encouraged regular meals with healthy proteins fruits and vegetables and snacks in between -Return in 1 month for follow-up -Provided contact information for psychiatrist to schedule next appointment    Dr. [name], DO [facility]

## 2023-04-03 MED ORDER — AMITRIPTYLINE HCL 25 MG PO TABS: 25.0000 mg | ORAL_TABLET | Freq: Every day | ORAL | 0 refills | Status: DC

## 2023-04-04 ENCOUNTER — Ambulatory Visit (INDEPENDENT_AMBULATORY_CARE_PROVIDER_SITE_OTHER): Payer: Medicaid Other | Admitting: Psychiatry

## 2023-04-04 ENCOUNTER — Encounter (HOSPITAL_COMMUNITY): Payer: Self-pay | Admitting: Psychiatry

## 2023-04-04 DIAGNOSIS — F12988 Cannabis use, unspecified with other cannabis-induced disorder: Secondary | ICD-10-CM

## 2023-04-04 DIAGNOSIS — F1290 Cannabis use, unspecified, uncomplicated: Secondary | ICD-10-CM | POA: Insufficient documentation

## 2023-04-04 DIAGNOSIS — F12288 Cannabis dependence with other cannabis-induced disorder: Secondary | ICD-10-CM

## 2023-04-04 NOTE — Patient Instructions (Signed)

## 2023-04-04 NOTE — Progress Notes (Signed)
Psychiatric Initial Adult Assessment  Patient Identification: Theresa Burnett MRN:  [id number] Date of Evaluation:  04/04/2023 Referral Source: [name], DO  Assessment:  Theresa Burnett is a 17 y.o. 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  Patient reports chronic issues with nausea and vomiting which on chart review have been attributed to persistent cannabis use.  Unfortunately, patient was only willing to participate in brief assessment today as patient was not conducting virtual visit from private location and was experiencing issues with data/connectivity.  She denied any acute safety concerns and reported that she was not interested in medication management but rather preferred to be referred for psychotherapy.  Was not able to screen for signs/symptoms of disordered eating given patient's disengagement from interview however given persistent vomiting with poor PO intake and low BMI, recommend continued careful monitoring through primary care with serial labs and weights.  Cannot rule out underlying mood or anxiety disorder and this requires further assessment.  Patient has identified benefit from Elavil for abdominal pain and irritability and it could be reasonable to further titrate this medication to target these symptoms.  Plan:  # Per chart review - Cannabis use disorder with associated cannabinoid hyperemesis syndrome/cyclical vomiting Past medication trials: Remeron 7.5 mg nightly, Buspar 10 mg TID, Zyprexa 2.5 mg BID, Haldol (concern for EPS), propranolol Status of problem: new problem to this provider Interventions: -- Patient prescribed Elavil by PCP and taking 50 mg nightly + 25 mg daily PRN abd pain with reported benefit -- Recommend regular CBC, CMP, Mg, Phos monitoring given  daily vomiting episodes as well as serial weights to closely monitor need for increased level of care -- She declines psychiatric medication management and opts to continue solely with psychotherapy -- Referral for individual psychotherapy placed  Patient was given contact information for behavioral health clinic and was instructed to call 911 for emergencies.   Subjective:  Chief Complaint:  Chief Complaint  Patient presents with   New Patient (Initial Visit)    History of Present Illness:    Chart review: -- Referred in Nov 2024 by PCP for cyclical vomiting syndrome. Considering inpatient admission at that time for failure to thrive. Rx amitriptyline 25-50 mg nightly for vomiting prophylaxis and mood. Next appt 12/13.  -- Last hospitalization Dec 2023 for refractory nausea and vomiting likely 2/2 cannabinoid hyperemesis syndrome -- Per [facility] [place] admission note Oct 2022: Ineffective previous treatments: Buspirone 10mg  TID (9/28- 10/2), Remeron 7.5 mg nightly  (9/28- 10/3), Zyprexa 5 mg BID (10/3-10/7), Compazine, Zofran, Dexamethasone IV 2 mg BID x2/5 days (10/10-10/12). Extensive prior GI work-up did not identify a clear explanation of symptoms (including MRI abdomen and pelvis, upper GI contrast x-ray, esophageal manometry, celiac disease screening, endoscopy and intestinal biopsies, porphyrins, catecholamines and metanephrines, mesenteric artery duplex, pelvic ultrasound).     Approx. 15 minutes was spent troubleshooting video connection issues at beginning of visit as she is in area with poor connection. Patient was able to join after link was sent to her personal email. She is noted to be in parked car and states she is alone and feels comfortable conducting visit.  She reports she was referred to psychiatry by her primary care doctor for "mood." Denies formal past psychiatric diagnoses.   Reports she has been dealing with chronic N/V for the past 3 years. States recently this  has been  better; continuing to have vomiting episodes about every few days. Previously occurring every time she ate or drank and was last at this level of severity about 3 weeks ago.    Vomiting usually occurs after eating/drinking. Denies having control over vomiting episodes. Reports sense of relief afterwards. Reports that stress or feeling cold makes vomiting worse.   Reports mood had been "irritable all the time" and will be short with others. However, states this has been better with Elavil and that mood lately has been "good" and getting along well with family. Taking Elavil 2 tablets at night and may take 1 during the day for stomach pain and finds it helpful. Not taking laxatives as having BM a few times per week and denies straining. Not taking K tablets - "they didn't give me any."  Anxiety is "not good." Reports getting easily overstimulated. Worries a lot about what others think. Reports when she gets mad, may hit friends. Denies SI or SIB urges. Denies HI.   At this point in interview, car is noted to be moving and patient is answering all questions with "I don't want to talk about that." Patient states she is not driving and another person is driving. She does not feel comfortable proceeding with visit as she is not in private place. She adds "my data is bad too." She does not wish to reschedule appt for medications as she states she is primarily interested in psychotherapy and only desires therapy referral at this time.  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    -- Cannabis: per chart review daily use  -- Requires further evaluation  Past Medical History:  Past Medical History:  Diagnosis Date   H. pylori infection     Past Surgical History:  Procedure Laterality Date   CHOLECYSTECTOMY      Family Psychiatric History: unable to evaluate  Family History:  Family History  Problem Relation Age of Onset   Kidney disease Maternal Grandmother    COPD Maternal Grandmother    Hypertension Maternal Grandmother    Diabetes Maternal Grandmother     Social History:   Social History   Socioeconomic History   Marital status: Single    Spouse name: Not on file   Number of children: Not on file   Years of education: Not on file   Highest education level: Not on file  Occupational History   Not on file  Tobacco Use   Smoking status: Former    Types: Cigarettes, E-cigarettes    Passive exposure: Current   Smokeless tobacco: Never  Vaping Use   Vaping status: Every Day   Substances: Flavoring  Substance and Sexual Activity   Alcohol use: Never   Drug use: Yes    Types: Marijuana   Sexual activity: Never  Other Topics Concern   Not on file  Social History Narrative   Patient lives with mother and 2 brothers.   Social Determinants of Health   Financial Resource Strain: Low Risk  (12/29/2020)  [facility]   Overall Financial Resource Strain (CARDIA)    Difficulty of Paying Living Expenses: Not very hard  Food Insecurity: No Food Insecurity (04/01/2022)   Hunger Vital Sign    Worried About Running Out of Food in the Last Year: Never true    Ran Out of Food in the Last Year: Never true  Transportation Needs: No Transportation Needs (04/01/2022)   PRAPARE - Administrator, Civil Service (Medical): No    Lack of Transportation (Non-Medical): No  Physical  Activity: Not on file  Stress: Not on file  Social Connections: Not on file    Additional Social History: updated  Allergies:  No Known Allergies  Current Medications: Current Outpatient Medications  Medication Sig Dispense Refill   amitriptyline (ELAVIL) 25 MG tablet Take 1 tablet (25 mg total) by mouth at bedtime. If tolerated well, can increased to 50 mg (2 tabs) at bedtime after 2 weeks 60 tablet 0   ondansetron (ZOFRAN-ODT) 4 MG disintegrating tablet Take 1 tablet (4 mg total) by mouth every 8 (eight) hours as needed for nausea or vomiting. 20 tablet 0   glycerin adult 2 g suppository Place 1 suppository rectally as needed for constipation. Do not use more than 1 in 24 hours (Patient not taking: Reported on 04/04/2023) 12 suppository 0   lactulose (CHRONULAC) 10 GM/15ML solution TAKE 1 PACKET (10 G TOTAL) BY MOUTH DAILY AS NEEDED. CAN INCREASE TO 2 PACKETS IF NEEDED (Patient not taking: Reported on 04/04/2023) 30 mL 0   Multiple Vitamin (MULTIVITAMIN WITH MINERALS) TABS tablet Take 1 tablet by mouth daily. (Patient not taking: Reported on 04/04/2023) 90 tablet 3   pantoprazole (PROTONIX) 40 MG tablet TAKE 1 TABLET BY MOUTH DAILY AS NEEDED (Patient not taking: Reported on 04/04/2023) 90 tablet 1   potassium chloride SA (KLOR-CON M) 20 MEQ tablet Take 1 tablet (20 mEq total) by mouth 2 (two) times daily. (Patient not taking: Reported on 04/04/2023) 8 tablet 0   promethazine (PHENERGAN) 25 MG suppository Place 1 suppository (25 mg total) rectally every 6 (six) hours as needed for nausea or vomiting. (Patient not taking: Reported on 04/04/2023) 12 each 0   senna (SENOKOT) 8.6 MG TABS tablet Take 1 tablet (8.6 mg total) by mouth daily as needed for mild constipation. (Patient not taking: Reported on 04/04/2023) 90 tablet 0   No current facility-administered medications for this visit.    ROS: Reports vomiting episodes approx. Every few days (reportedly improved from prior in which she was  vomiting multiple times daily)  Objective:  Psychiatric Specialty Exam: There were no vitals taken for this visit.There is no height or weight on file to calculate BMI.  Estimated body mass index is 17.57 kg/m as calculated from the following:   Height as of 03/06/23: 5\' 3"  (1.6 m).   Weight as of 03/06/23: 99 lb 3.2 oz (45 kg).   General Appearance: Casual and Fairly Groomed  Eye Contact:  Poor  Speech:  Clear and Coherent and Normal Rate  Volume:  Normal  Mood:   "irritated"  Affect:   Blunted; guarded  Thought Content:  Does not report AVH    Suicidal Thoughts:  No  Homicidal Thoughts:  No  Thought Process:  Brief/limited  Orientation:  Full (Time, Place, and Person)    Memory: Grossly intact  Judgment:  Impaired  Insight:   Limited  Concentration:  Concentration: Poor  Recall:  not formally assessed  [employer]  of Knowledge: Good  Language: Good  Psychomotor Activity:  Normal  Akathisia:  NA  AIMS (if indicated): NA  Assets:  Therapist, sports  ADL's:  Intact  Cognition: WNL  Sleep:   not assessed   PE: General: sits comfortably in view of camera; no acute distress  Pulm: no increased work of breathing on room air  MSK: all extremity movements appear intact  Neuro: no focal neurological deficits observed  Gait & Station: unable to assess by video    Metabolic Disorder Labs: Lab Results  Component Value Date   HGBA1C 5.1 11/02/2021   MPG 99.67 11/02/2021   No results found for: "PROLACTIN" No results found for: "CHOL", "TRIG", "HDL", "CHOLHDL", "VLDL", "LDLCALC" Lab Results  Component Value Date   TSH 3.593 08/15/2021    Therapeutic Level Labs: No results found for: "LITHIUM" No results found for: "CBMZ" No results found for: "VALPROATE"  Screenings:  GAD-7    Flowsheet Row Office Visit [facility] 11/14/2021 in [place] [facility]  Total GAD-7 Score 6      PHQ2-9     Flowsheet Row Office Visit from 12/14/2022 in [facility] [place]. [facility] Office Visit from [facility]/18/2022 in [facility] Of [place] [facility]g[place]. [facility] Office Visit from 05/03/2022 in [facility] Of [place]. [facility] Office Visit from 11/14/2021 in [place] [facility] Office Visit from 09/26/2021 in [facility] [place]  PHQ-2 Total Score 2 2 3 2 2   PHQ-9 Total Score 7 5 7 3 8       Flowsheet Row ED from 02/18/2023 in [facility] Department at [facility] ED from 01/15/2023 in [facility] Department at [facility] ED from 01/09/2023 in [facility] Department at [facility]  C-SSRS RISK CATEGORY No Risk No Risk No Risk       Collaboration of Care: Collaboration of Care: Referral or follow-up with counselor/therapist AEB referral to individual psychotherapy  Patient/Guardian was advised Release of Information must be obtained prior to any record release in order to collaborate their care with an outside provider. Patient/Guardian was advised if they have not already done so to contact the registration department to sign all necessary forms in order for [place] to release information regarding their care.   Consent: Patient/Guardian gives verbal consent for treatment and assignment of benefits for services provided during this visit. Patient/Guardian expressed understanding and agreed to proceed.   Televisit via video: I connected with Theresa Burnett on 04/04/23 at  1:00 PM EST by a video enabled telemedicine application and verified that I am speaking with the correct person using two identifiers.  Location: Patient: parked car in Center Provider: remote office in [place]   I discussed the limitations of evaluation and management by telemedicine and the  availability of in person appointments. The patient expressed understanding and agreed to proceed.  I discussed the assessment and treatment plan with the patient. The patient was provided an opportunity to ask questions and all were answered. The patient agreed with the plan and demonstrated an understanding of the instructions.   The patient was advised to call back or seek an in-person evaluation if the symptoms worsen or if the  condition fails to improve as anticipated.  I provided 40 minutes dedicated to the care of this patient via video on the date of this encounter to include chart review, face-to-face time with the patient, referral to appropriate resources.  Theresa Burnett 12/11/20241:54 PM

## 2023-04-06 ENCOUNTER — Ambulatory Visit (INDEPENDENT_AMBULATORY_CARE_PROVIDER_SITE_OTHER): Payer: Medicaid Other | Admitting: Student

## 2023-04-06 VITALS — BP 123/81 | HR 73 | Ht 63.0 in | Wt 116.6 lb

## 2023-04-06 DIAGNOSIS — R1115 Cyclical vomiting syndrome unrelated to migraine: Secondary | ICD-10-CM

## 2023-04-06 MED ORDER — ONDANSETRON 4 MG PO TBDP: 4.0000 mg | ORAL_TABLET | Freq: Three times a day (TID) | ORAL | 0 refills | Status: DC | PRN

## 2023-04-06 NOTE — Patient Instructions (Addendum)
It was great to see you! Thank you for allowing me to participate in your care!  I recommend that you always bring your medications to each appointment as this makes it easy to ensure you are on the correct medications and helps [place] not miss when refills are needed.  Our plans for today:  - zofran sent to pharmacy - Schedule apt in 1 month. Return sooner if needed - continue Amitriptyline 50 mg at bed time every night - you spoke with Dr. Theodoro Kos for your last psychiatry visit   Call to schedule next apt: [facility]  [address], [place], [place] [address] [phone] [facility] 24/7 FOR ANYONE [address] [place], [place]  [phone] Fax: [phone] [email or web address]    We are checking some labs today, I will call you if they are abnormal will send you a MyChart message or a letter if they are normal.  If you do not hear about your labs in the next 2 weeks please let us know.  Take care and seek immediate care sooner if you develop any concerns.   Dr. [name], DO [facility]

## 2023-04-06 NOTE — Assessment & Plan Note (Signed)
Patient is doing significantly better on amitriptyline and is very happy with the results.  Weight has increased by 17 pounds since last month and her mood is noticeably improved -CMP, Phos, mag today for monitoring -Continue amitriptyline 50 mg at bedtime -Zofran refilled to use as needed -Encouraged regular meals with healthy proteins fruits and vegetables and snacks in between -Return in 1 month for follow-up -Provided contact information for psychiatrist to schedule next appointment

## 2023-04-07 LAB — MAGNESIUM: Magnesium: 1.7 mg/dL (ref 1.6–2.3)

## 2023-04-07 LAB — COMPREHENSIVE METABOLIC PANEL
ALT: 12 [IU]/L (ref 0–32)
AST: 15 [IU]/L (ref 0–40)
Albumin: 4.6 g/dL (ref 4.0–5.0)
Alkaline Phosphatase: 69 [IU]/L (ref 42–106)
BUN/Creatinine Ratio: 12 (ref 9–23)
BUN: 7 mg/dL (ref 6–20)
Bilirubin Total: 0.4 mg/dL (ref 0.0–1.2)
CO2: 25 mmol/L (ref 20–29)
Calcium: 9.9 mg/dL (ref 8.7–10.2)
Chloride: 102 mmol/L (ref 96–106)
Creatinine, Ser: 0.58 mg/dL (ref 0.57–1.00)
Globulin, Total: 2.5 g/dL (ref 1.5–4.5)
Glucose: 60 mg/dL — ABNORMAL LOW (ref 70–99)
Potassium: 4.5 mmol/L (ref 3.5–5.2)
Sodium: 140 mmol/L (ref 134–144)
Total Protein: 7.1 g/dL (ref 6.0–8.5)
eGFR: 134 mL/min/{1.73_m2} (ref >59)

## 2023-04-07 LAB — CBC
Hematocrit: 39.3 % (ref 34.0–46.6)
Hemoglobin: 13.2 g/dL (ref 11.1–15.9)
MCH: 31.4 pg (ref 26.6–33.0)
MCHC: 33.6 g/dL (ref 31.5–35.7)
MCV: 94 fL (ref 79–97)
Platelets: 328 10*3/uL (ref 150–450)
RBC: 4.2 x10E6/uL (ref 3.77–5.28)
RDW: 13.7 % (ref 11.7–15.4)
WBC: 4.7 10*3/uL (ref 3.4–10.8)

## 2023-04-07 LAB — PHOSPHORUS: Phosphorus: 3.8 mg/dL (ref 3.3–5.1)

## 2023-04-09 ENCOUNTER — Encounter: Payer: Self-pay | Admitting: Student

## 2023-04-10 ENCOUNTER — Encounter (HOSPITAL_COMMUNITY): Payer: Self-pay

## 2023-04-10 ENCOUNTER — Emergency Department (HOSPITAL_COMMUNITY)
Admission: EM | Admit: 2023-04-10 | Discharge: 2023-04-10 | Disposition: A | Discharge status: Home / Self Care | Payer: Medicaid Other | Attending: Emergency Medicine | Admitting: Emergency Medicine

## 2023-04-10 DIAGNOSIS — F1229 Cannabis dependence with unspecified cannabis-induced disorder: Secondary | ICD-10-CM | POA: Diagnosis not present

## 2023-04-10 DIAGNOSIS — R1115 Cyclical vomiting syndrome unrelated to migraine: Secondary | ICD-10-CM | POA: Insufficient documentation

## 2023-04-10 DIAGNOSIS — R112 Nausea with vomiting, unspecified: Secondary | ICD-10-CM | POA: Diagnosis present

## 2023-04-10 DIAGNOSIS — F12288 Cannabis dependence with other cannabis-induced disorder: Secondary | ICD-10-CM

## 2023-04-10 LAB — COMPREHENSIVE METABOLIC PANEL
ALT: 15 U/L (ref 0–44)
AST: 25 U/L (ref 15–41)
Albumin: 4.7 g/dL (ref 3.5–5.0)
Alkaline Phosphatase: 57 U/L (ref 38–126)
Anion gap: 15 (ref 5–15)
BUN: 7 mg/dL (ref 6–20)
CO2: 19 mmol/L — ABNORMAL LOW (ref 22–32)
Calcium: 9.5 mg/dL (ref 8.9–10.3)
Chloride: 104 mmol/L (ref 98–111)
Creatinine, Ser: 0.66 mg/dL (ref 0.44–1.00)
GFR, Estimated: >60 mL/min (ref >60)
Glucose, Bld: 153 mg/dL — ABNORMAL HIGH (ref 70–99)
Potassium: 2.9 mmol/L — ABNORMAL LOW (ref 3.5–5.1)
Sodium: 138 mmol/L (ref 135–145)
Total Bilirubin: 0.6 mg/dL (ref <1.2)
Total Protein: 8.3 g/dL — ABNORMAL HIGH (ref 6.5–8.1)

## 2023-04-10 LAB — CBC WITH DIFFERENTIAL/PLATELET
Abs Immature Granulocytes: 0.03 10*3/uL (ref 0.00–0.07)
Basophils Absolute: 0 10*3/uL (ref 0.0–0.1)
Basophils Relative: 0 %
Eosinophils Absolute: 0 10*3/uL (ref 0.0–0.5)
Eosinophils Relative: 0 %
HCT: 40.6 % (ref 36.0–46.0)
Hemoglobin: 13.7 g/dL (ref 12.0–15.0)
Immature Granulocytes: 0 %
Lymphocytes Relative: 15 %
Lymphs Abs: 1.5 10*3/uL (ref 0.7–4.0)
MCH: 31.7 pg (ref 26.0–34.0)
MCHC: 33.7 g/dL (ref 30.0–36.0)
MCV: 94 fL (ref 80.0–100.0)
Monocytes Absolute: 0.5 10*3/uL (ref 0.1–1.0)
Monocytes Relative: 5 %
Neutro Abs: 8.3 10*3/uL — ABNORMAL HIGH (ref 1.7–7.7)
Neutrophils Relative %: 80 %
Platelets: 314 10*3/uL (ref 150–400)
RBC: 4.32 MIL/uL (ref 3.87–5.11)
RDW: 15.3 % (ref 11.5–15.5)
WBC: 10.5 10*3/uL (ref 4.0–10.5)
nRBC: 0 % (ref 0.0–0.2)

## 2023-04-10 LAB — LIPASE, BLOOD: Lipase: 29 U/L (ref 11–51)

## 2023-04-10 MED ORDER — POTASSIUM CHLORIDE CRYS ER 20 MEQ PO TBCR: 20.0000 meq | EXTENDED_RELEASE_TABLET | Freq: Two times a day (BID) | ORAL | 0 refills | Status: DC

## 2023-04-10 MED ORDER — DROPERIDOL 2.5 MG/ML IJ SOLN
1.2500 mg | Freq: Once | INTRAMUSCULAR | Status: AC
  Administered 2023-04-10: 1.25 mg via INTRAVENOUS
  Filled 2023-04-10: qty 2

## 2023-04-10 MED ORDER — ONDANSETRON HCL 4 MG PO TABS
4.0000 mg | ORAL_TABLET | Freq: Four times a day (QID) | ORAL | 0 refills | Status: DC
Start: 2023-04-10 — End: 2023-06-22

## 2023-04-10 MED ORDER — ONDANSETRON HCL 4 MG/2ML IJ SOLN
4.0000 mg | Freq: Once | INTRAMUSCULAR | Status: AC
  Administered 2023-04-10: 4 mg via INTRAVENOUS
  Filled 2023-04-10: qty 2

## 2023-04-10 MED ORDER — PANTOPRAZOLE SODIUM 40 MG PO TBEC: 40.0000 mg | DELAYED_RELEASE_TABLET | Freq: Every day | ORAL | 1 refills | Status: DC | PRN

## 2023-04-10 MED ORDER — SODIUM CHLORIDE 0.9 % IV BOLUS
1000.0000 mL | Freq: Once | INTRAVENOUS | Status: AC
Start: 2023-04-10 — End: 2023-04-10
  Administered 2023-04-10: 1000 mL via INTRAVENOUS

## 2023-04-10 MED ORDER — POTASSIUM CHLORIDE 10 MEQ/100ML IV SOLN
10.0000 meq | Freq: Once | INTRAVENOUS | Status: AC
  Administered 2023-04-10: 10 meq via INTRAVENOUS
  Filled 2023-04-10: qty 100

## 2023-04-10 MED ORDER — PROMETHAZINE HCL 25 MG RE SUPP: 25.0000 mg | Freq: Four times a day (QID) | RECTAL | 0 refills | Status: DC | PRN

## 2023-04-10 MED ORDER — POTASSIUM CHLORIDE CRYS ER 20 MEQ PO TBCR
40.0000 meq | EXTENDED_RELEASE_TABLET | Freq: Once | ORAL | Status: DC
  Filled 2023-04-10: qty 2

## 2023-04-10 MED ORDER — ACETAMINOPHEN 500 MG PO TABS
1000.0000 mg | ORAL_TABLET | Freq: Once | ORAL | Status: DC
  Filled 2023-04-10: qty 2

## 2023-04-10 NOTE — ED Provider Notes (Signed)
[place] EMERGENCY DEPARTMENT AT [facility] Provider Note   CSN: [id number] Arrival date & time: 04/10/23  1653     History Chief Complaint  Patient presents with   Emesis    HPI Theresa Burnett is a 17 y.o. adult presenting for recurrent nausea vomiting.  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  Continues to smoke marijuana every day. Multiple ER visits for similar.  Seen by PCP and referred to outpatient psychiatric resources for detox. Mother at bedside provides further history.  They deny fevers chills syncope shortness of breath.  Was doing well this morning sudden onset approximate 2 hours prior to arrival.  Patient's recorded medical, surgical, social, medication list and allergies were reviewed in the Snapshot window as part of the initial history.   Review of Systems   Review of Systems  Constitutional:  Negative for chills and fever.  HENT:  Negative for ear pain and sore throat.   Eyes:  Negative for pain and visual disturbance.  Respiratory:  Negative for cough and shortness of breath.   Cardiovascular:  Negative for chest pain and palpitations.  Gastrointestinal:  Positive for nausea and vomiting. Negative for abdominal pain.  Genitourinary:  Negative for dysuria and hematuria.  Musculoskeletal:  Negative for arthralgias and back pain.  Skin:  Negative for color change and rash.  Neurological:  Negative for seizures and syncope.  All other systems reviewed and are negative.   Physical Exam Updated Vital Signs BP (!) 165/128   Pulse (!) 127   Temp 98.2 F (36.8 C)   Resp 15   Ht 5\' 3"  (1.6 m)   Wt 52.6 kg   SpO2 100%   BMI 20.55 kg/m  Physical Exam Vitals and nursing note reviewed.  Constitutional:      General: Theresa Burnett is not in acute distress.    Appearance: Theresa Burnett is well-developed.  HENT:     Head: Normocephalic and atraumatic.  Eyes:     Conjunctiva/sclera: Conjunctivae normal.  Cardiovascular:      Rate and Rhythm: Normal rate and regular rhythm.     Heart sounds: No murmur heard. Pulmonary:     Effort: Pulmonary effort is normal. No respiratory distress.     Breath sounds: Normal breath sounds.  Abdominal:     Palpations: Abdomen is soft.     Tenderness: There is no abdominal tenderness.  Musculoskeletal:        General: No swelling.     Cervical back: Neck supple.  Skin:    General: Skin is warm and dry.     Capillary Refill: Capillary refill takes less than 2 seconds.  Neurological:     Mental Status: Theresa Burnett is alert.  Psychiatric:        Mood and Affect: Mood normal.      ED Course/ Medical Decision Making/ A&P    Procedures Procedures   Medications Ordered in ED Medications  potassium chloride SA (KLOR-CON M) CR tablet 40 mEq (40 mEq Oral Not Given 04/10/23 1925)  acetaminophen (TYLENOL) tablet 1,000 mg (1,000 mg Oral Not Given 04/10/23 1949)  droperidol (INAPSINE) 2.5 MG/ML injection 1.25 mg (1.25 mg Intravenous Given 04/10/23 1738)  sodium chloride 0.9 % bolus 1,000 mL (0 mLs Intravenous Stopped 04/10/23 2116)  potassium chloride 10 mEq in 100 mL IVPB (0 mEq Intravenous Stopped 04/10/23 2116)  ondansetron (ZOFRAN) injection 4 mg (4 mg Intravenous Given 04/10/23 1948)  droperidol (INAPSINE) 2.5 MG/ML injection 1.25 mg (1.25 mg Intravenous Given 04/10/23 2146)  Medical Decision Making:   17 year old presenting with recurrent cyclic vomiting syndrome.  Treated with droperidol during the triage process and grossly improving by the time of my main assessment. Will evaluate for critical emergency with EKG, screening lab work including CBC, CMP and plan for reassessment. Reassessment: On reassessment her potassium is low repleted in emergency department p.o. IV.  She is now tolerating p.o. intake and not any acute distress.  Discussed importance of follow-up with PCP for ongoing care management outpatient setting and patient in agreement.  Disposition:  I have  considered need for hospitalization, however, considering all of the above, I believe this patient is stable for discharge at this time.  Patient/family educated about specific return precautions for given chief complaint and symptoms.  Patient/family educated about follow-up with PCP.     Patient/family expressed understanding of return precautions and need for follow-up. Patient spoken to regarding all imaging and laboratory results and appropriate follow up for these results. All education provided in verbal form with additional information in written form. Time was allowed for answering of patient questions. Patient discharged.    Emergency Department Medication Summary:   Medications  potassium chloride SA (KLOR-CON M) CR tablet 40 mEq (40 mEq Oral Not Given 04/10/23 1925)  acetaminophen (TYLENOL) tablet 1,000 mg (1,000 mg Oral Not Given 04/10/23 1949)  droperidol (INAPSINE) 2.5 MG/ML injection 1.25 mg (1.25 mg Intravenous Given 04/10/23 1738)  sodium chloride 0.9 % bolus 1,000 mL (0 mLs Intravenous Stopped 04/10/23 2116)  potassium chloride 10 mEq in 100 mL IVPB (0 mEq Intravenous Stopped 04/10/23 2116)  ondansetron (ZOFRAN) injection 4 mg (4 mg Intravenous Given 04/10/23 1948)  droperidol (INAPSINE) 2.5 MG/ML injection 1.25 mg (1.25 mg Intravenous Given 04/10/23 2146)        Clinical Impression:  1. Cyclic vomiting syndrome   2. Cannabis hyperemesis syndrome concurrent with and due to cannabis dependence [facility])      Discharge   Final Clinical Impression(s) / ED Diagnoses Final diagnoses:  Cyclic vomiting syndrome    Rx / DC Orders ED Discharge Orders          Ordered    potassium chloride SA (KLOR-CON M) 20 MEQ tablet  2 times daily        04/10/23 2302    promethazine (PHENERGAN) 25 MG suppository  Every 6 hours PRN        04/10/23 2302    pantoprazole (PROTONIX) 40 MG tablet  Daily PRN        04/10/23 2302    ondansetron (ZOFRAN) 4 MG tablet  Every 6 hours         04/10/23 2302              [name] Ade, MD 04/10/23 2302

## 2023-04-10 NOTE — ED Notes (Signed)
Pt stated "I can't take the potassium pills cause I'm throwing up right now." No emesis was noted by writer upon present contact with Pt.

## 2023-04-10 NOTE — ED Triage Notes (Addendum)
Patient has been vomiting since this morning. Generalized abdominal pain. No diarrhea. No blood in vomit. Patient hyperventilating in triage, crying, rolling around the chair. Heart rate 140 in triage.

## 2023-04-10 NOTE — ED Notes (Signed)
Ivin Booty PA notified.

## 2023-04-10 NOTE — ED Notes (Signed)
Pt was asleep when entering room. Upon attempting to administer tylenol, Pt stated "I Won't be able to take that right now because I'm throwing up." Pt refused medication

## 2023-04-19 ENCOUNTER — Other Ambulatory Visit: Payer: Self-pay | Admitting: Student

## 2023-04-19 DIAGNOSIS — R1115 Cyclical vomiting syndrome unrelated to migraine: Secondary | ICD-10-CM

## 2023-04-19 DIAGNOSIS — K5900 Constipation, unspecified: Secondary | ICD-10-CM

## 2023-05-10 ENCOUNTER — Ambulatory Visit: Payer: Medicaid Other | Admitting: Student

## 2023-05-22 ENCOUNTER — Ambulatory Visit (HOSPITAL_COMMUNITY): Payer: Medicaid Other | Admitting: Psychiatry

## 2023-05-29 ENCOUNTER — Other Ambulatory Visit: Payer: Self-pay | Admitting: Student

## 2023-05-29 DIAGNOSIS — R1115 Cyclical vomiting syndrome unrelated to migraine: Secondary | ICD-10-CM

## 2023-06-18 IMAGING — US US ABDOMEN LIMITED
1 series · 14 of 25 positions shown · non-contrast
Comparison: None.

CLINICAL DATA: Epigastric pain and abdominal pain with nausea and
vomiting x2 weeks.

EXAM:
ULTRASOUND ABDOMEN LIMITED RIGHT UPPER QUADRANT

[Series 1: us abdomen limited ruq (liver/gb) · 14 of 45 slices shown]
[im 1/45]
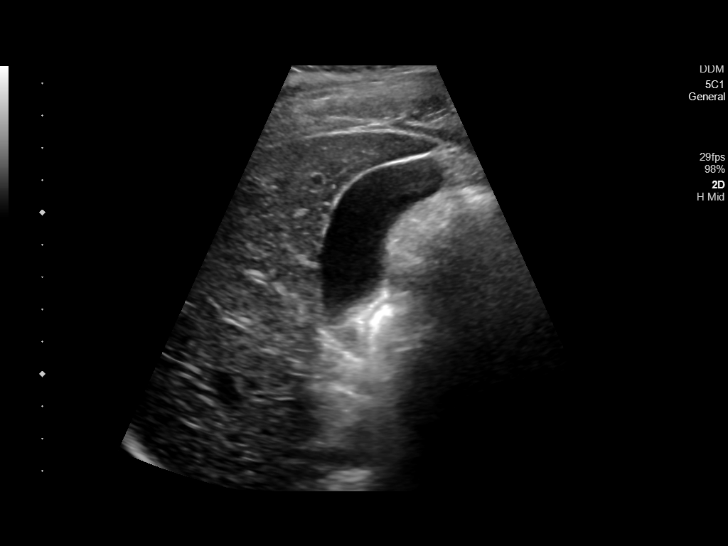
[im 4/45]
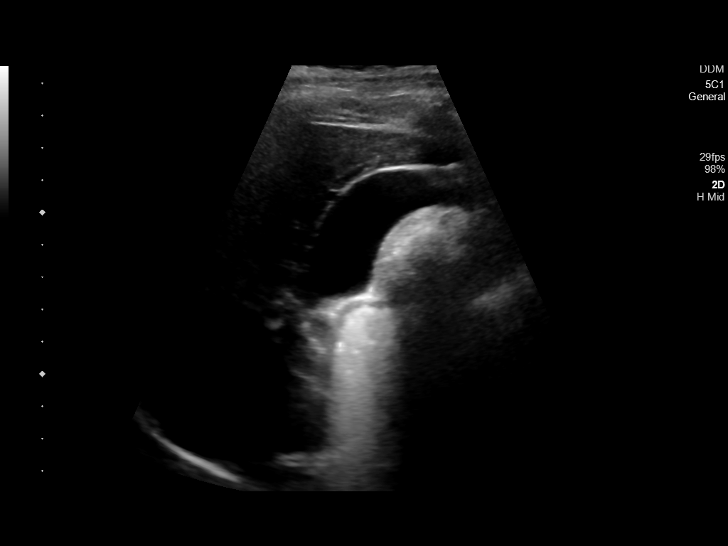
[im 8/45]
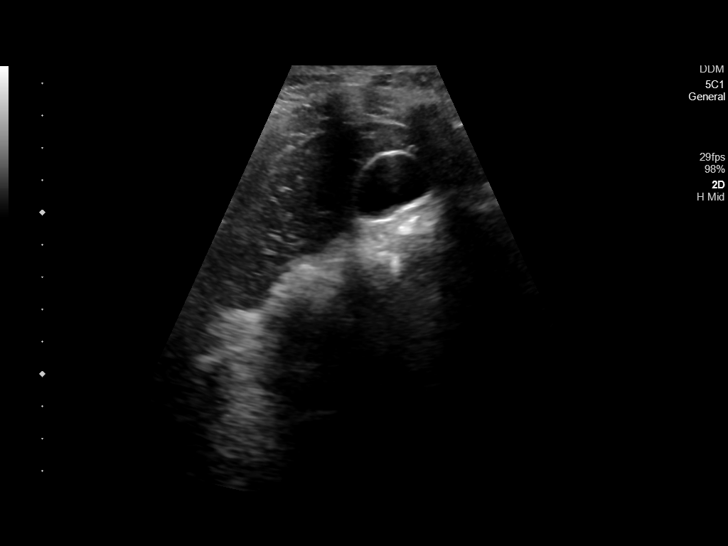
[im 12/45]
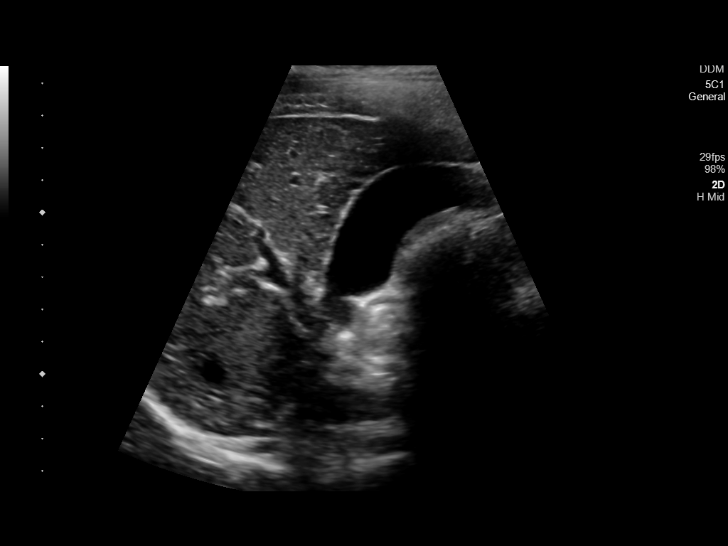
[im 15/45]
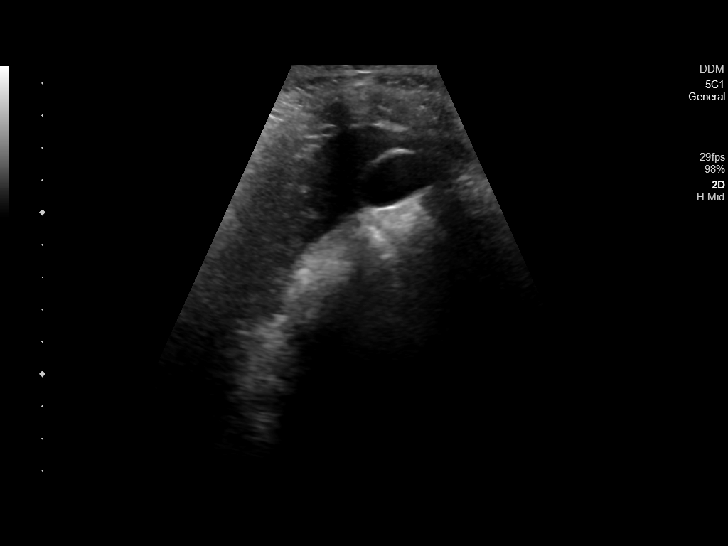
[im 17/45]
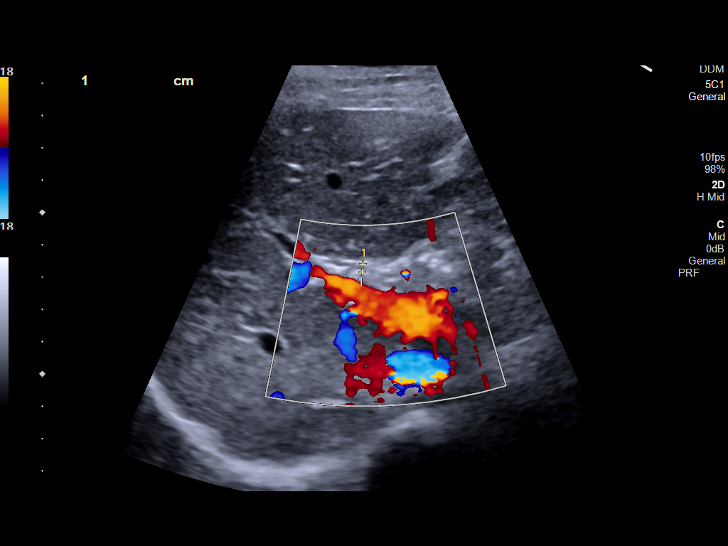
[im 21/45]
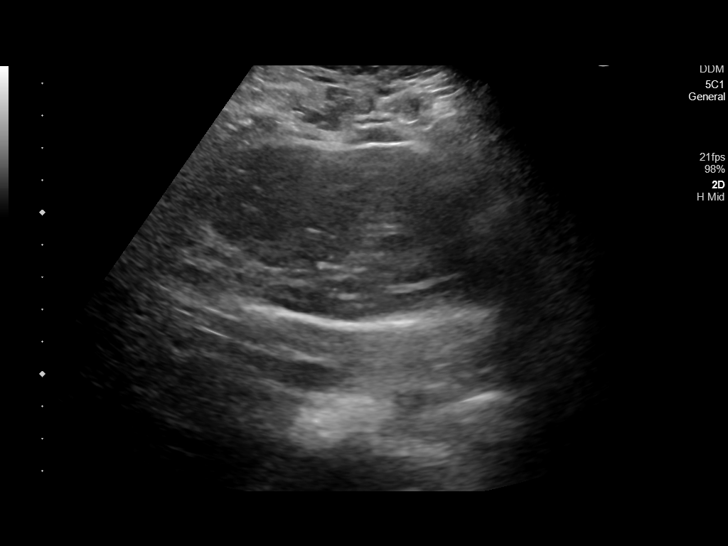
[im 24/45]
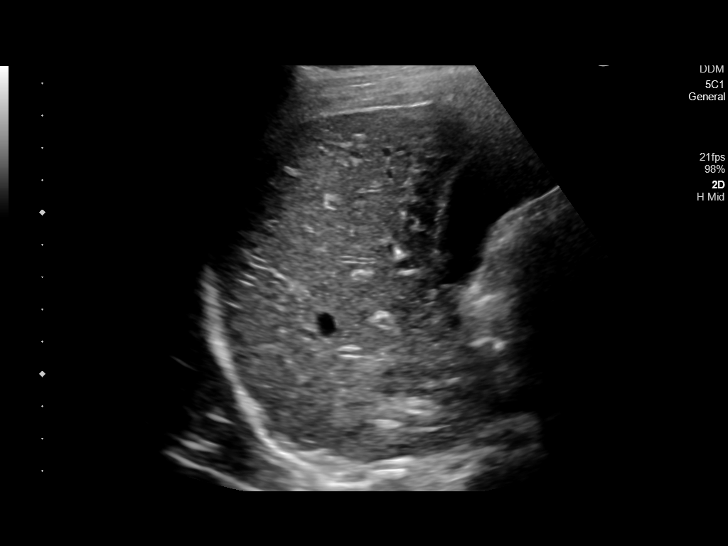
[im 28/45]
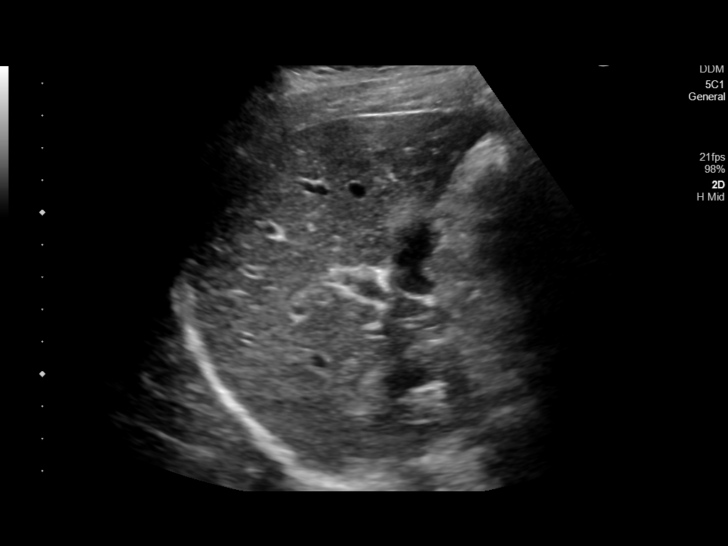
[im 30/45]
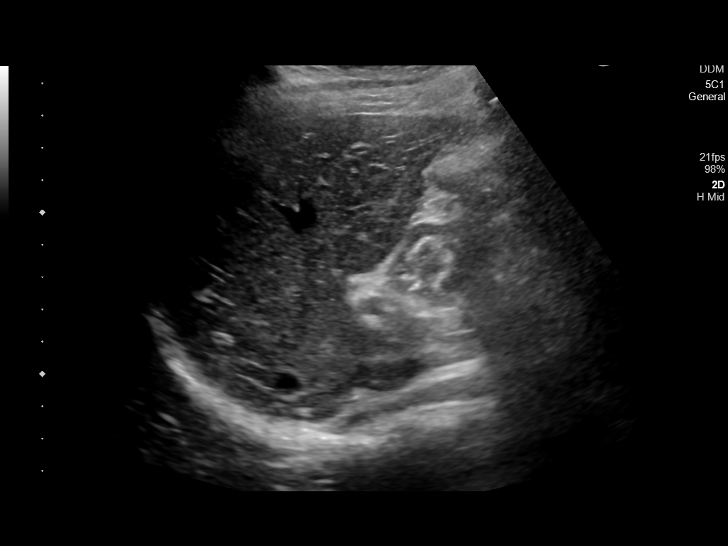
[im 34/45]
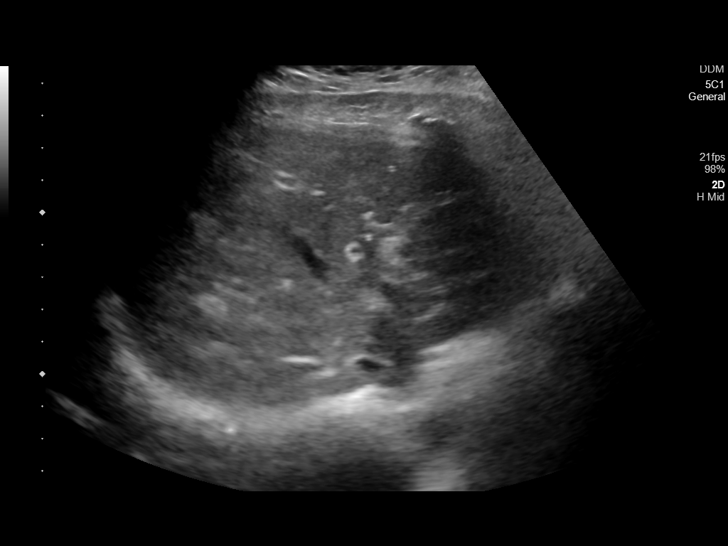
[im 37/45]
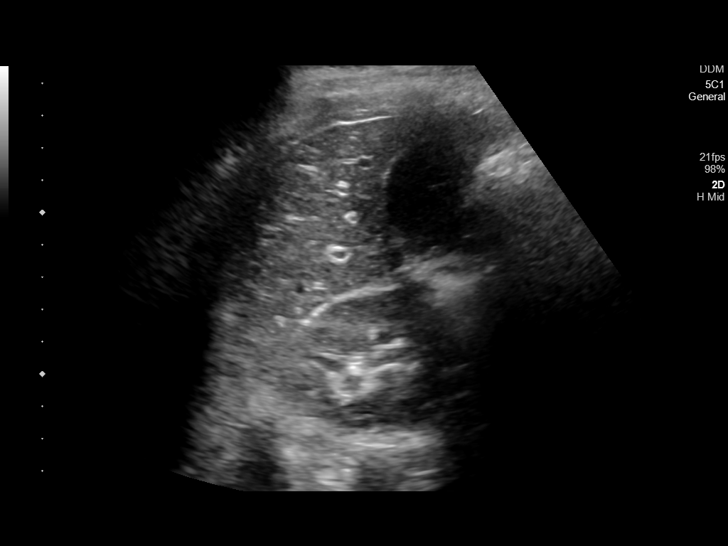
[im 41/45]
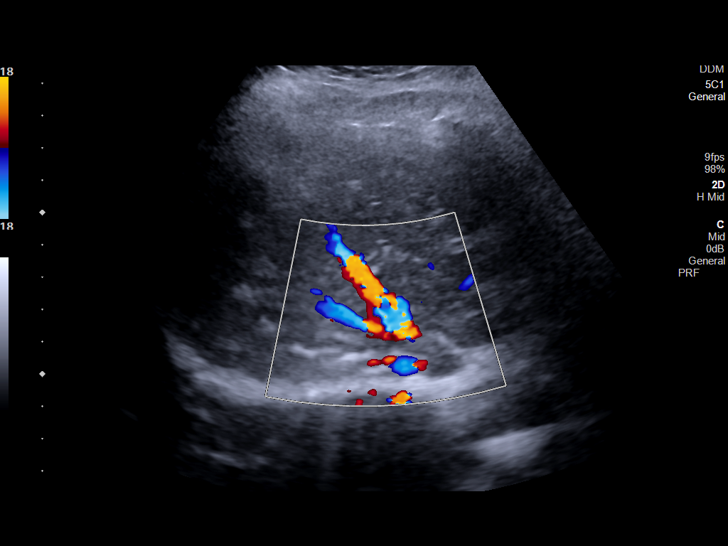
[im 45/45]
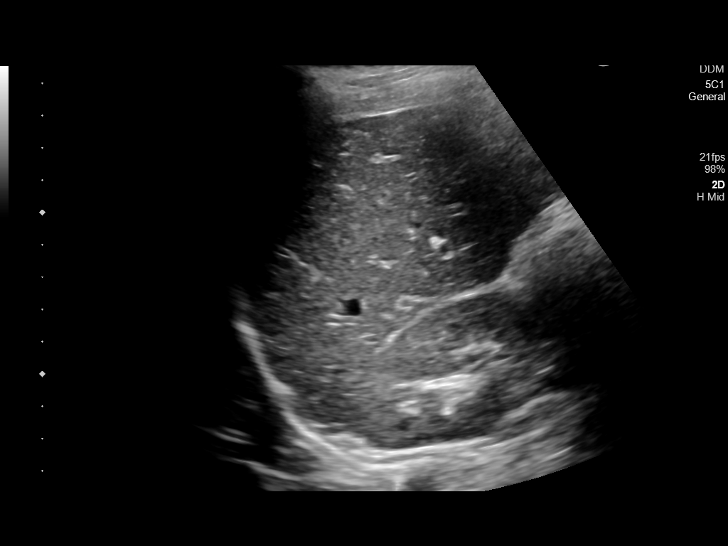

[14 of 25 positions shown; findings below may reference images not displayed]

FINDINGS: Gallbladder:

No gallstones or wall thickening visualized (1.4 mm). No sonographic
Murphy sign noted by sonographer.

Common bile duct:

Diameter: 2.4 mm

Liver:

No focal lesion identified. Within normal limits in parenchymal
echogenicity. Portal vein is patent on color Doppler imaging with
normal direction of blood flow towards the liver.

Other: None.
IMPRESSION: Normal right upper quadrant ultrasound.

## 2023-06-21 ENCOUNTER — Emergency Department (HOSPITAL_COMMUNITY)
Admission: EM | Admit: 2023-06-21 | Discharge: 2023-06-22 | Disposition: A | Discharge status: Home / Self Care | Payer: Medicaid Other | Attending: Emergency Medicine | Admitting: Emergency Medicine

## 2023-06-21 DIAGNOSIS — R1115 Cyclical vomiting syndrome unrelated to migraine: Secondary | ICD-10-CM

## 2023-06-21 DIAGNOSIS — K2900 Acute gastritis without bleeding: Secondary | ICD-10-CM | POA: Diagnosis not present

## 2023-06-21 DIAGNOSIS — R1010 Upper abdominal pain, unspecified: Secondary | ICD-10-CM | POA: Diagnosis present

## 2023-06-21 LAB — CBC WITH DIFFERENTIAL/PLATELET
Abs Immature Granulocytes: 0.01 10*3/uL (ref 0.00–0.07)
Basophils Absolute: 0 10*3/uL (ref 0.0–0.1)
Basophils Relative: 1 %
Eosinophils Absolute: 0 10*3/uL (ref 0.0–0.5)
Eosinophils Relative: 1 %
HCT: 44.6 % (ref 36.0–46.0)
Hemoglobin: 15 g/dL (ref 12.0–15.0)
Immature Granulocytes: 0 %
Lymphocytes Relative: 35 %
Lymphs Abs: 1.8 10*3/uL (ref 0.7–4.0)
MCH: 30.9 pg (ref 26.0–34.0)
MCHC: 33.6 g/dL (ref 30.0–36.0)
MCV: 92 fL (ref 80.0–100.0)
Monocytes Absolute: 0.4 10*3/uL (ref 0.1–1.0)
Monocytes Relative: 9 %
Neutro Abs: 2.8 10*3/uL (ref 1.7–7.7)
Neutrophils Relative %: 54 %
Platelets: 331 10*3/uL (ref 150–400)
RBC: 4.85 MIL/uL (ref 3.87–5.11)
RDW: 14.3 % (ref 11.5–15.5)
WBC: 5.1 10*3/uL (ref 4.0–10.5)
nRBC: 0 % (ref 0.0–0.2)

## 2023-06-21 LAB — COMPREHENSIVE METABOLIC PANEL
ALT: 12 U/L (ref 0–44)
AST: 21 U/L (ref 15–41)
Albumin: 4.8 g/dL (ref 3.5–5.0)
Alkaline Phosphatase: 58 U/L (ref 38–126)
Anion gap: 13 (ref 5–15)
BUN: 14 mg/dL (ref 6–20)
CO2: 27 mmol/L (ref 22–32)
Calcium: 9.3 mg/dL (ref 8.9–10.3)
Chloride: 92 mmol/L — ABNORMAL LOW (ref 98–111)
Creatinine, Ser: 0.61 mg/dL (ref 0.44–1.00)
GFR, Estimated: >60 mL/min (ref >60)
Glucose, Bld: 105 mg/dL — ABNORMAL HIGH (ref 70–99)
Potassium: 3.3 mmol/L — ABNORMAL LOW (ref 3.5–5.1)
Sodium: 132 mmol/L — ABNORMAL LOW (ref 135–145)
Total Bilirubin: 1 mg/dL (ref 0.0–1.2)
Total Protein: 9.2 g/dL — ABNORMAL HIGH (ref 6.5–8.1)

## 2023-06-21 LAB — LIPASE, BLOOD: Lipase: 21 U/L (ref 11–51)

## 2023-06-21 LAB — HCG, SERUM, QUALITATIVE: Preg, Serum: NEGATIVE

## 2023-06-21 NOTE — ED Provider Triage Note (Signed)
 Emergency Medicine Provider Triage Evaluation Note  Theresa Burnett , a 18 y.o. adult  was evaluated in triage.  Pt complains of N/V, abd pain x 1 wk.  Review of Systems  Positive: N/V, chills, fatgiue, abd pain Negative: Diarrhea, HA, CP, sHOB  Physical Exam  BP 125/83 (BP Location: Left Arm)   Pulse (!) 106   Temp 98 F (36.7 C) (Oral)   Resp 17   Ht 5\' 3"  (1.6 m)   Wt 46.3 kg   SpO2 100%   BMI 18.07 kg/m  Gen:   Awake, no distress   Resp:  Normal effort  MSK:   Moves extremities without difficulty  Other:    Medical Decision Making  Medically screening exam initiated at 8:36 PM.  Appropriate orders placed.  Theresa Burnett was informed that the remainder of the evaluation will be completed by another provider, this initial triage assessment does not replace that evaluation, and the importance of remaining in the ED until their evaluation is complete.  Labs ordered   Theresa Burnett, [place] 06/21/23 2037

## 2023-06-21 NOTE — ED Triage Notes (Signed)
 Pt c/o abdominal pain, n/v for one week. Pt also c/o chills, body aches

## 2023-06-22 ENCOUNTER — Encounter: Payer: Self-pay | Admitting: Student

## 2023-06-22 MED ORDER — FAMOTIDINE IN NACL 20-0.9 MG/50ML-% IV SOLN
20.0000 mg | Freq: Once | INTRAVENOUS | Status: AC
  Administered 2023-06-22: 20 mg via INTRAVENOUS
  Filled 2023-06-22: qty 50

## 2023-06-22 MED ORDER — METOCLOPRAMIDE HCL 5 MG/ML IJ SOLN
10.0000 mg | Freq: Once | INTRAMUSCULAR | Status: AC
  Administered 2023-06-22: 10 mg via INTRAVENOUS
  Filled 2023-06-22: qty 2

## 2023-06-22 MED ORDER — DICYCLOMINE HCL 20 MG PO TABS: 20.0000 mg | ORAL_TABLET | Freq: Three times a day (TID) | ORAL | 0 refills | Status: DC

## 2023-06-22 MED ORDER — SUCRALFATE 1 G PO TABS: 1.0000 g | ORAL_TABLET | Freq: Three times a day (TID) | ORAL | 0 refills | Status: DC

## 2023-06-22 MED ORDER — PANTOPRAZOLE SODIUM 40 MG PO TBEC: 40.0000 mg | DELAYED_RELEASE_TABLET | Freq: Every day | ORAL | 3 refills | Status: DC

## 2023-06-22 MED ORDER — SODIUM CHLORIDE 0.9 % IV BOLUS
1000.0000 mL | Freq: Once | INTRAVENOUS | Status: AC
  Administered 2023-06-22: 1000 mL via INTRAVENOUS

## 2023-06-22 MED ORDER — ONDANSETRON HCL 4 MG/2ML IJ SOLN
4.0000 mg | Freq: Once | INTRAMUSCULAR | Status: AC
  Administered 2023-06-22: 4 mg via INTRAVENOUS
  Filled 2023-06-22: qty 2

## 2023-06-22 NOTE — ED Provider Notes (Signed)
 Theresa Burnett Provider Note   CSN: [id number] Arrival date & time: 06/21/23  1959     History  Chief Complaint  Patient presents with   Abdominal Pain   Emesis    Theresa Burnett is a 18 y.o. adult.  Patient presents to the urgency department for evaluation of upper abdominal pain with nausea and vomiting.  Patient reports that symptoms have been ongoing for 1 week.  She reports that she has had intermittent problems similar to this in the past, had her gallbladder out 2 years ago for similar symptoms but still has been having problems.  She has not had any hematemesis or rectal bleeding.  Symptoms exacerbated by eating, has not been able to hold anything down.       Home Medications Prior to Admission medications   Medication Sig Start Date End Date Taking? Authorizing Provider  dicyclomine (BENTYL) 20 MG tablet Take 1 tablet (20 mg total) by mouth 3 (three) times daily before meals. 06/22/23  Yes Rhema Boyett, Canary Brim, MD  pantoprazole (PROTONIX) 40 MG tablet Take 1 tablet (40 mg total) by mouth daily. 06/22/23  Yes Mostafa Yuan, Canary Brim, MD  sucralfate (CARAFATE) 1 g tablet Take 1 tablet (1 g total) by mouth 4 (four) times daily -  with meals and at bedtime. 06/22/23  Yes Emmagene Ortner, Canary Brim, MD      Allergies    Patient has no known allergies.    Review of Systems   Review of Systems  Physical Exam Updated Vital Signs BP 117/82 (BP Location: Left Arm)   Pulse 95   Temp 98.4 F (36.9 C) (Oral)   Resp 15   Ht 5\' 3"  (1.6 m)   Wt 46.3 kg   LMP  (LMP Unknown)   SpO2 98%   BMI 18.07 kg/m  Physical Exam Vitals and nursing note reviewed.  Constitutional:      General: Theresa Burnett is not in acute distress.    Appearance: Dainelle is well-developed.  HENT:     Head: Normocephalic and atraumatic.     Mouth/Throat:     Mouth: Mucous membranes are moist.  Eyes:     General: Vision grossly intact. Gaze aligned  appropriately.     Extraocular Movements: Extraocular movements intact.     Conjunctiva/sclera: Conjunctivae normal.  Cardiovascular:     Rate and Rhythm: Normal rate and regular rhythm.     Pulses: Normal pulses.     Heart sounds: Normal heart sounds, S1 normal and S2 normal. No murmur heard.    No friction rub. No gallop.  Pulmonary:     Effort: Pulmonary effort is normal. No respiratory distress.     Breath sounds: Normal breath sounds.  Abdominal:     General: Bowel sounds are normal.     Palpations: Abdomen is soft.     Tenderness: There is abdominal tenderness in the epigastric area. There is no guarding or rebound.     Hernia: No hernia is present.  Musculoskeletal:        General: No swelling.     Cervical back: Full passive range of motion without pain, normal range of motion and neck supple. No spinous process tenderness or muscular tenderness. Normal range of motion.     Right lower leg: No edema.     Left lower leg: No edema.  Skin:    General: Skin is warm and dry.     Capillary Refill: Capillary refill takes less than 2  seconds.     Findings: No ecchymosis, erythema, rash or wound.  Neurological:     General: No focal deficit present.     Mental Status: Coral is alert and oriented to person, place, and time.     GCS: GCS eye subscore is 4. GCS verbal subscore is 5. GCS motor subscore is 6.     Cranial Nerves: Cranial nerves 2-12 are intact.     Sensory: Sensation is intact.     Motor: Motor function is intact.     Coordination: Coordination is intact.  Psychiatric:        Attention and Perception: Attention normal.        Mood and Affect: Mood normal.        Speech: Speech normal.        Behavior: Behavior normal.     ED Results / Procedures / Treatments   Labs (all labs ordered are listed, but only abnormal results are displayed) Labs Reviewed  COMPREHENSIVE METABOLIC PANEL - Abnormal; Notable for the following components:      Result Value   Sodium 132  (*)    Potassium 3.3 (*)    Chloride 92 (*)    Glucose, Bld 105 (*)    Total Protein 9.2 (*)    All other components within normal limits  CBC WITH DIFFERENTIAL/PLATELET  LIPASE, BLOOD  HCG, SERUM, QUALITATIVE  URINALYSIS, ROUTINE W REFLEX MICROSCOPIC    EKG None  Radiology No results found.  Procedures Procedures    Medications Ordered in ED Medications  sodium chloride 0.9 % bolus 1,000 mL (1,000 mLs Intravenous New Bag/Given 06/22/23 0040)  ondansetron (ZOFRAN) injection 4 mg (4 mg Intravenous Given 06/22/23 0040)  metoCLOPramide (REGLAN) injection 10 mg (10 mg Intravenous Given 06/22/23 0040)  famotidine (PEPCID) IVPB 20 mg premix (0 mg Intravenous Stopped 06/22/23 0147)    ED Course/ Medical Decision Making/ A&P                                 Medical Decision Making Risk Prescription drug management.   Differential Diagnosis considered includes, but not limited to: bowel obstruction; esophagitis; gastritis; peptic ulcer disease; pancreatitis; cardiac.  Presents to the emergency department for evaluation of upper abdominal pain with nausea and vomiting.  Patient has had prior cholecystectomy, gallbladder disease not part of the differential.  Patient with mild epigastric tenderness, no focal tenderness elsewhere in the abdomen.  No lower abdominal or pelvic complaints or findings on exam.  Pregnancy negative.  Lab work reassuring.  Treated with antiemetics, IV fluids.  Likely gastritis.  Patient reports intermittent similar symptoms for some time, would benefit from GI follow-up.        Final Clinical Impression(s) / ED Diagnoses Final diagnoses:  Acute gastritis without hemorrhage, unspecified gastritis type    Rx / DC Orders ED Discharge Orders          Ordered    pantoprazole (PROTONIX) 40 MG tablet  Daily        06/22/23 0147    sucralfate (CARAFATE) 1 g tablet  3 times daily with meals & bedtime        06/22/23 0147    dicyclomine (BENTYL) 20 MG  tablet  3 times daily before meals        06/22/23 0147              Gilda Crease, MD 06/22/23 [phone]

## 2023-07-13 ENCOUNTER — Encounter (HOSPITAL_COMMUNITY): Payer: Self-pay

## 2023-07-13 ENCOUNTER — Other Ambulatory Visit: Payer: Self-pay

## 2023-07-13 ENCOUNTER — Emergency Department (HOSPITAL_COMMUNITY)
Admission: EM | Admit: 2023-07-13 | Discharge: 2023-07-14 | Disposition: A | Discharge status: Home / Self Care | Attending: Emergency Medicine | Admitting: Emergency Medicine

## 2023-07-13 DIAGNOSIS — E876 Hypokalemia: Secondary | ICD-10-CM | POA: Diagnosis not present

## 2023-07-13 DIAGNOSIS — R109 Unspecified abdominal pain: Secondary | ICD-10-CM

## 2023-07-13 DIAGNOSIS — R112 Nausea with vomiting, unspecified: Secondary | ICD-10-CM | POA: Insufficient documentation

## 2023-07-13 DIAGNOSIS — R1012 Left upper quadrant pain: Secondary | ICD-10-CM | POA: Diagnosis present

## 2023-07-13 MED ORDER — SODIUM CHLORIDE 0.9 % IV BOLUS
1000.0000 mL | Freq: Once | INTRAVENOUS | Status: AC
  Administered 2023-07-14: 1000 mL via INTRAVENOUS

## 2023-07-13 MED ORDER — FAMOTIDINE IN NACL 20-0.9 MG/50ML-% IV SOLN
20.0000 mg | Freq: Once | INTRAVENOUS | Status: AC
  Administered 2023-07-14: 20 mg via INTRAVENOUS
  Filled 2023-07-13: qty 50

## 2023-07-13 MED ORDER — SUCRALFATE 1 G PO TABS
1.0000 g | ORAL_TABLET | Freq: Once | ORAL | Status: DC
  Filled 2023-07-13: qty 1

## 2023-07-13 MED ORDER — METOCLOPRAMIDE HCL 5 MG/ML IJ SOLN
10.0000 mg | INTRAMUSCULAR | Status: AC
  Administered 2023-07-14: 10 mg via INTRAVENOUS
  Filled 2023-07-13: qty 2

## 2023-07-13 NOTE — ED Triage Notes (Signed)
 N/V for 3-4 days with generalized abdominal pain

## 2023-07-13 NOTE — ED Notes (Signed)
 Attempted to get labs but unsuccessful.

## 2023-07-13 NOTE — ED Provider Notes (Signed)
 [place] EMERGENCY DEPARTMENT AT [facility] Provider Note   CSN: [id number] Arrival date & time: 07/13/23  2254     History  Chief Complaint  Patient presents with   Emesis    Theresa Burnett is a 18 y.o. adult.  The history is provided by the patient and medical records.  Emesis Associated symptoms: abdominal pain    18 year old female with history of cyclic vomiting, iron deficiency anemia, cannabinoid hyperemesis, presenting to the ED with abdominal pain.  Reports this has been ongoing for about 4 days now.  Feels like a gnawing pain in her left upper abdomen.  Reports she has had this before.  She denies any diarrhea.  No fever or chills.  He is status post cholecystectomy.  Home Medications Prior to Admission medications   Medication Sig Start Date End Date Taking? Authorizing Provider  dicyclomine (BENTYL) 20 MG tablet Take 1 tablet (20 mg total) by mouth 3 (three) times daily before meals. 06/22/23   Gilda Crease, MD  pantoprazole (PROTONIX) 40 MG tablet Take 1 tablet (40 mg total) by mouth daily. 06/22/23   Gilda Crease, MD  sucralfate (CARAFATE) 1 g tablet Take 1 tablet (1 g total) by mouth 4 (four) times daily -  with meals and at bedtime. 06/22/23   Gilda Crease, MD      Allergies    Patient has no known allergies.    Review of Systems   Review of Systems  Gastrointestinal:  Positive for abdominal pain, nausea and vomiting.  All other systems reviewed and are negative.   Physical Exam Updated Vital Signs BP (!) 130/93   Pulse 99   Temp 97.8 F (36.6 C) (Axillary)   Resp 17   Ht 5\' 3"  (1.6 m)   Wt 45.4 kg   LMP  (LMP Unknown)   SpO2 100%   BMI 17.71 kg/m   Physical Exam Vitals and nursing note reviewed.  Constitutional:      Appearance: Theresa Burnett is well-developed.     Comments: Sitting with heating pad on stomach  HENT:     Head: Normocephalic and atraumatic.  Eyes:     Conjunctiva/sclera: Conjunctivae  normal.     Pupils: Pupils are equal, round, and reactive to light.  Cardiovascular:     Rate and Rhythm: Normal rate and regular rhythm.     Heart sounds: Normal heart sounds.  Pulmonary:     Effort: Pulmonary effort is normal.     Breath sounds: Normal breath sounds.  Abdominal:     General: Bowel sounds are normal.     Palpations: Abdomen is soft.     Tenderness: There is no abdominal tenderness. There is no rebound.     Comments: Endorsed pain to LUQ without focal tenderness, no peritoneal signs  Musculoskeletal:        General: Normal range of motion.     Cervical back: Normal range of motion.  Skin:    General: Skin is warm and dry.  Neurological:     Mental Status: Theresa Burnett is alert and oriented to person, place, and time.     ED Results / Procedures / Treatments   Labs (all labs ordered are listed, but only abnormal results are displayed) Labs Reviewed  COMPREHENSIVE METABOLIC PANEL - Abnormal; Notable for the following components:      Result Value   Potassium 2.9 (*)    Chloride 95 (*)    Glucose, Bld 114 (*)    Total  Protein 9.0 (*)    All other components within normal limits  CBC - Abnormal; Notable for the following components:   Platelets 419 (*)    All other components within normal limits  URINALYSIS, ROUTINE W REFLEX MICROSCOPIC - Abnormal; Notable for the following components:   APPearance HAZY (*)    Bilirubin Urine SMALL (*)    Ketones, ur 20 (*)    Protein, ur >=300 (*)    Leukocytes,Ua TRACE (*)    All other components within normal limits  LIPASE, BLOOD  HCG, SERUM, QUALITATIVE    EKG None  Radiology No results found.  Procedures Procedures    Medications Ordered in ED Medications  sucralfate (CARAFATE) tablet 1 g (1 g Oral Patient Refused/Not Given 07/14/23 0125)  potassium chloride SA (KLOR-CON M) CR tablet 40 mEq (40 mEq Oral Patient Refused/Not Given 07/14/23 0126)  metoCLOPramide (REGLAN) injection 10 mg (10 mg Intravenous Given  07/14/23 0019)  famotidine (PEPCID) IVPB 20 mg premix (0 mg Intravenous Stopped 07/14/23 0055)  sodium chloride 0.9 % bolus 1,000 mL (0 mLs Intravenous Stopped 07/14/23 0133)  dicyclomine (BENTYL) capsule 10 mg (10 mg Oral Given 07/14/23 0125)    ED Course/ Medical Decision Making/ A&P                                 Medical Decision Making Amount and/or Complexity of Data Reviewed Labs: ordered. ECG/medicine tests: ordered and independent interpretation performed.  Risk Prescription drug management.   18 y.o. F here with abdominal pain, nausea/vomiting.  Longstanding history of same, does have history of cyclic vomiting as well as cannabinoid hyperemesis.  She is afebrile and nontoxic in appearance here.  She denies eating spicy/acidic foods, however empty bag of "flaming hot doritos" in room trashcan.  Will check labs, IVF and antiemetics ordered.  Labs as above--no leukocytosis.  Potassium is low at 2.9.  Ordered oral replacement but she refused.  Normal lipase.  UA without signs of infection.  Requesting to eat now, will PO challenge.  1:46 AM Has tolerated sandwich and drink here.  No active emesis while in the ED.  She is requesting to be discharged which seems appropriate..  Will continue PPI, bentyl, zofran PRN.  Encouraged to follow-up with PCP.  Recommended diet modification.  Can return here for new concerns.  Final Clinical Impression(s) / ED Diagnoses Final diagnoses:  Abdominal pain, unspecified abdominal location  Hypokalemia    Rx / DC Orders ED Discharge Orders          Ordered    dicyclomine (BENTYL) 20 MG tablet  2 times daily        07/14/23 0145    pantoprazole (PROTONIX) 40 MG tablet  Daily        07/14/23 0145    ondansetron (ZOFRAN-ODT) 4 MG disintegrating tablet  Every 8 hours PRN        07/14/23 0145              Garlon Hatchet, PA-C 07/14/23 0149    Palumbo, April, MD 07/14/23 2956

## 2023-07-14 LAB — COMPREHENSIVE METABOLIC PANEL
ALT: 12 U/L (ref 0–44)
AST: 17 U/L (ref 15–41)
Albumin: 4.8 g/dL (ref 3.5–5.0)
Alkaline Phosphatase: 61 U/L (ref 38–126)
Anion gap: 14 (ref 5–15)
BUN: 16 mg/dL (ref 6–20)
CO2: 30 mmol/L (ref 22–32)
Calcium: 10.3 mg/dL (ref 8.9–10.3)
Chloride: 95 mmol/L — ABNORMAL LOW (ref 98–111)
Creatinine, Ser: 0.58 mg/dL (ref 0.44–1.00)
GFR, Estimated: >60 mL/min (ref >60)
Glucose, Bld: 114 mg/dL — ABNORMAL HIGH (ref 70–99)
Potassium: 2.9 mmol/L — ABNORMAL LOW (ref 3.5–5.1)
Sodium: 139 mmol/L (ref 135–145)
Total Bilirubin: 1 mg/dL (ref 0.0–1.2)
Total Protein: 9 g/dL — ABNORMAL HIGH (ref 6.5–8.1)

## 2023-07-14 LAB — URINALYSIS, ROUTINE W REFLEX MICROSCOPIC
Bacteria, UA: NONE SEEN
Glucose, UA: NEGATIVE mg/dL
Hgb urine dipstick: NEGATIVE
Ketones, ur: 20 mg/dL — AB
Nitrite: NEGATIVE
Protein, ur: >=300 mg/dL — AB
Specific Gravity, Urine: 1.03 (ref 1.005–1.030)
pH: 7 (ref 5.0–8.0)

## 2023-07-14 LAB — HCG, SERUM, QUALITATIVE: Preg, Serum: NEGATIVE

## 2023-07-14 LAB — LIPASE, BLOOD: Lipase: 21 U/L (ref 11–51)

## 2023-07-14 LAB — CBC
HCT: 44.3 % (ref 36.0–46.0)
Hemoglobin: 14.6 g/dL (ref 12.0–15.0)
MCH: 30.2 pg (ref 26.0–34.0)
MCHC: 33 g/dL (ref 30.0–36.0)
MCV: 91.5 fL (ref 80.0–100.0)
Platelets: 419 10*3/uL — ABNORMAL HIGH (ref 150–400)
RBC: 4.84 MIL/uL (ref 3.87–5.11)
RDW: 15.2 % (ref 11.5–15.5)
WBC: 9.2 10*3/uL (ref 4.0–10.5)
nRBC: 0 % (ref 0.0–0.2)

## 2023-07-14 MED ORDER — DICYCLOMINE HCL 10 MG PO CAPS
10.0000 mg | ORAL_CAPSULE | Freq: Once | ORAL | Status: AC
  Administered 2023-07-14: 10 mg via ORAL
  Filled 2023-07-14: qty 1

## 2023-07-14 MED ORDER — POTASSIUM CHLORIDE CRYS ER 20 MEQ PO TBCR
40.0000 meq | EXTENDED_RELEASE_TABLET | Freq: Once | ORAL | Status: DC
  Filled 2023-07-14: qty 2

## 2023-07-14 MED ORDER — ONDANSETRON 4 MG PO TBDP: 4.0000 mg | ORAL_TABLET | Freq: Three times a day (TID) | ORAL | 0 refills | Status: DC | PRN

## 2023-07-14 MED ORDER — PANTOPRAZOLE SODIUM 40 MG PO TBEC: 40.0000 mg | DELAYED_RELEASE_TABLET | Freq: Every day | ORAL | 0 refills | Status: DC

## 2023-07-14 MED ORDER — DICYCLOMINE HCL 20 MG PO TABS: 20.0000 mg | ORAL_TABLET | Freq: Two times a day (BID) | ORAL | 0 refills | Status: DC

## 2023-07-14 NOTE — Discharge Instructions (Addendum)
 Take the prescribed medication as directed. I would avoid spicy/acidic foods.  Start with bland diet for now, advance as tolerated. Follow-up with your doctor. Return to the ED for new or worsening symptoms.

## 2023-07-14 NOTE — ED Notes (Signed)
 Pt given sandwich and ginger ale for PO challenge

## 2023-08-04 ENCOUNTER — Other Ambulatory Visit: Payer: Self-pay

## 2023-08-04 ENCOUNTER — Emergency Department (HOSPITAL_COMMUNITY)
Admission: EM | Admit: 2023-08-04 | Discharge: 2023-08-04 | Disposition: A | Discharge status: Home / Self Care | Attending: Emergency Medicine | Admitting: Emergency Medicine

## 2023-08-04 ENCOUNTER — Emergency Department (HOSPITAL_COMMUNITY)

## 2023-08-04 DIAGNOSIS — R109 Unspecified abdominal pain: Secondary | ICD-10-CM

## 2023-08-04 DIAGNOSIS — K5900 Constipation, unspecified: Secondary | ICD-10-CM | POA: Diagnosis present

## 2023-08-04 DIAGNOSIS — R1084 Generalized abdominal pain: Secondary | ICD-10-CM | POA: Diagnosis not present

## 2023-08-04 DIAGNOSIS — R112 Nausea with vomiting, unspecified: Secondary | ICD-10-CM | POA: Insufficient documentation

## 2023-08-04 DIAGNOSIS — J189 Pneumonia, unspecified organism: Secondary | ICD-10-CM | POA: Diagnosis not present

## 2023-08-04 DIAGNOSIS — Z79899 Other long term (current) drug therapy: Secondary | ICD-10-CM | POA: Diagnosis not present

## 2023-08-04 LAB — URINALYSIS, W/ REFLEX TO CULTURE (INFECTION SUSPECTED)
Bilirubin Urine: NEGATIVE
Glucose, UA: NEGATIVE mg/dL
Hgb urine dipstick: NEGATIVE
Ketones, ur: 20 mg/dL — AB
Leukocytes,Ua: NEGATIVE
Nitrite: NEGATIVE
Protein, ur: 30 mg/dL — AB
Specific Gravity, Urine: 1.031 — ABNORMAL HIGH (ref 1.005–1.030)
pH: 8 (ref 5.0–8.0)

## 2023-08-04 LAB — CBC WITH DIFFERENTIAL/PLATELET
Abs Immature Granulocytes: 0.02 10*3/uL (ref 0.00–0.07)
Basophils Absolute: 0 10*3/uL (ref 0.0–0.1)
Basophils Relative: 0 %
Eosinophils Absolute: 0 10*3/uL (ref 0.0–0.5)
Eosinophils Relative: 0 %
HCT: 43.5 % (ref 36.0–46.0)
Hemoglobin: 14.2 g/dL (ref 12.0–15.0)
Immature Granulocytes: 0 %
Lymphocytes Relative: 16 %
Lymphs Abs: 1 10*3/uL (ref 0.7–4.0)
MCH: 29.6 pg (ref 26.0–34.0)
MCHC: 32.6 g/dL (ref 30.0–36.0)
MCV: 90.6 fL (ref 80.0–100.0)
Monocytes Absolute: 0.1 10*3/uL (ref 0.1–1.0)
Monocytes Relative: 2 %
Neutro Abs: 5.2 10*3/uL (ref 1.7–7.7)
Neutrophils Relative %: 82 %
Platelets: UNDETERMINED 10*3/uL (ref 150–400)
RBC: 4.8 MIL/uL (ref 3.87–5.11)
RDW: 15.2 % (ref 11.5–15.5)
WBC: 6.3 10*3/uL (ref 4.0–10.5)
nRBC: 0 % (ref 0.0–0.2)

## 2023-08-04 LAB — RAPID URINE DRUG SCREEN, HOSP PERFORMED
Amphetamines: NOT DETECTED
Barbiturates: NOT DETECTED
Benzodiazepines: NOT DETECTED
Cocaine: NOT DETECTED
Opiates: POSITIVE — AB
Tetrahydrocannabinol: POSITIVE — AB

## 2023-08-04 LAB — COMPREHENSIVE METABOLIC PANEL WITH GFR
ALT: 14 U/L (ref 0–44)
AST: 31 U/L (ref 15–41)
Albumin: 4.4 g/dL (ref 3.5–5.0)
Alkaline Phosphatase: 61 U/L (ref 38–126)
Anion gap: 12 (ref 5–15)
BUN: 5 mg/dL — ABNORMAL LOW (ref 6–20)
CO2: 24 mmol/L (ref 22–32)
Calcium: 9.8 mg/dL (ref 8.9–10.3)
Chloride: 103 mmol/L (ref 98–111)
Creatinine, Ser: 0.45 mg/dL (ref 0.44–1.00)
GFR, Estimated: >60 mL/min (ref >60)
Glucose, Bld: 139 mg/dL — ABNORMAL HIGH (ref 70–99)
Potassium: 4 mmol/L (ref 3.5–5.1)
Sodium: 139 mmol/L (ref 135–145)
Total Bilirubin: 0.7 mg/dL (ref 0.0–1.2)
Total Protein: 9.1 g/dL — ABNORMAL HIGH (ref 6.5–8.1)

## 2023-08-04 LAB — LIPASE, BLOOD: Lipase: 26 U/L (ref 11–51)

## 2023-08-04 LAB — HCG, SERUM, QUALITATIVE: Preg, Serum: NEGATIVE

## 2023-08-04 LAB — LACTIC ACID, PLASMA: Lactic Acid, Venous: 1.5 mmol/L (ref 0.5–1.9)

## 2023-08-04 MED ORDER — DOXYCYCLINE HYCLATE 100 MG PO TABS
100.0000 mg | ORAL_TABLET | Freq: Once | ORAL | Status: AC
  Administered 2023-08-04: 100 mg via ORAL
  Filled 2023-08-04: qty 1

## 2023-08-04 MED ORDER — IOHEXOL 300 MG/ML  SOLN
80.0000 mL | Freq: Once | INTRAMUSCULAR | Status: AC | PRN
  Administered 2023-08-04: 80 mL via INTRAVENOUS

## 2023-08-04 MED ORDER — MORPHINE SULFATE (PF) 4 MG/ML IV SOLN
4.0000 mg | Freq: Once | INTRAVENOUS | Status: AC
  Administered 2023-08-04: 4 mg via INTRAVENOUS
  Filled 2023-08-04: qty 1

## 2023-08-04 MED ORDER — SODIUM CHLORIDE 0.9 % IV SOLN
25.0000 mg | Freq: Once | INTRAVENOUS | Status: AC
  Administered 2023-08-04: 25 mg via INTRAVENOUS
  Filled 2023-08-04: qty 25

## 2023-08-04 MED ORDER — OXYCODONE-ACETAMINOPHEN 5-325 MG PO TABS: 1.0000 | ORAL_TABLET | Freq: Four times a day (QID) | ORAL | 0 refills | Status: DC | PRN

## 2023-08-04 MED ORDER — SODIUM CHLORIDE 0.9 % IV BOLUS
1000.0000 mL | Freq: Once | INTRAVENOUS | Status: AC
  Administered 2023-08-04: 1000 mL via INTRAVENOUS

## 2023-08-04 MED ORDER — DOXYCYCLINE HYCLATE 100 MG PO CAPS: 100.0000 mg | ORAL_CAPSULE | Freq: Two times a day (BID) | ORAL | 0 refills | Status: AC

## 2023-08-04 MED ORDER — PROMETHAZINE HCL 25 MG PO TABS: 25.0000 mg | ORAL_TABLET | Freq: Three times a day (TID) | ORAL | 0 refills | Status: DC | PRN

## 2023-08-04 NOTE — ED Provider Notes (Signed)
 [place] EM[facility]GENCY DEPARTMENT AT [facility] Provider Note   CSN: [id number] Arrival date & time: 08/04/23  0741     History  No chief complaint on file.   Theresa Burnett is a 18 y.o. adult.  The history is provided by the patient, medical records and the EMS personnel. No language interpreter was used.  Abdominal Pain Pain location:  Generalized Pain quality: aching and cramping   Pain radiates to:  Does not radiate Pain severity:  Severe Onset quality:  Gradual Duration:  2 days Timing:  Constant Progression:  Waxing and waning Chronicity:  Recurrent Context: not trauma   Relieved by:  Nothing Worsened by:  Palpation Ineffective treatments:  None tried Associated symptoms: constipation, nausea and vomiting   Associated symptoms: no chest pain, no chills, no cough, no diarrhea, no dysuria, no fatigue, no fever, no shortness of breath, no vaginal bleeding and no vaginal discharge        Home Medications Prior to Admission medications   Medication Sig Start Date End Date Taking? Authorizing Provider  dicyclomine (BENTYL) 20 MG tablet Take 1 tablet (20 mg total) by mouth 2 (two) times daily. 07/14/23   Coretha Dew, PA-C  ondansetron (ZOFRAN-ODT) 4 MG disintegrating tablet Take 1 tablet (4 mg total) by mouth every 8 (eight) hours as needed. 07/14/23   Coretha Dew, PA-C  pantoprazole (PROTONIX) 40 MG tablet Take 1 tablet (40 mg total) by mouth daily. 07/14/23   Coretha Dew, PA-C  sucralfate (CARAFATE) 1 g tablet Take 1 tablet (1 g total) by mouth 4 (four) times daily -  with meals and at bedtime. 06/22/23   Ballard Bongo, MD      Allergies    Patient has no known allergies.    Review of Systems   Review of Systems  Constitutional:  Negative for chills, fatigue and fever.  HENT:  Positive for congestion.   Respiratory:  Negative for cough, chest tightness, shortness of breath and wheezing.   Cardiovascular:  Negative for chest pain,  palpitations and leg swelling.  Gastrointestinal:  Positive for abdominal pain, constipation, nausea and vomiting. Negative for diarrhea.  Genitourinary:  Negative for dysuria, flank pain, frequency, vaginal bleeding and vaginal discharge.  Musculoskeletal:  Negative for back pain.  Neurological:  Negative for headaches.  Psychiatric/Behavioral:  Negative for agitation and confusion.   All other systems reviewed and are negative.   Physical Exam Updated Vital Signs BP (!) 130/102 (BP Location: Right Arm)   Pulse (!) 107   Temp 98.6 F (37 C) (Oral)   Resp 18   Ht 5\' 3"  (1.6 m)   Wt 46.7 kg   SpO2 100%   BMI 18.25 kg/m  Physical Exam Vitals and nursing note reviewed.  Constitutional:      General: Theresa Burnett is not in acute distress.    Appearance: Theresa Burnett is well-developed. Theresa Burnett is not ill-appearing, toxic-appearing or diaphoretic.  HENT:     Head: Normocephalic and atraumatic.     Right Ear: External ear normal.     Left Ear: External ear normal.     Nose: Congestion present. No rhinorrhea.     Mouth/Throat:     Pharynx: No oropharyngeal exudate or posterior oropharyngeal erythema.  Eyes:     Extraocular Movements: Extraocular movements intact.     Conjunctiva/sclera: Conjunctivae normal.     Pupils: Pupils are equal, round, and reactive to light.  Cardiovascular:     Rate and Rhythm: Tachycardia present.  Pulmonary:     Effort: No respiratory distress.     Breath sounds: No stridor. No wheezing, rhonchi or rales.  Chest:     Chest wall: No tenderness.  Abdominal:     General: Abdomen is flat. There is no distension.     Tenderness: There is abdominal tenderness. There is no right CVA tenderness, left CVA tenderness, guarding or rebound.  Musculoskeletal:        General: No tenderness.     Cervical back: Normal range of motion and neck supple. No tenderness.  Skin:    General: Skin is warm.     Coloration: Skin is not pale.     Findings: No erythema or rash.   Neurological:     General: No focal deficit present.     Mental Status: Theresa Burnett is alert and oriented to person, place, and time.     Motor: No abnormal muscle tone.     Deep Tendon Reflexes: Reflexes are normal and symmetric.     ED Results / Procedures / Treatments   Labs (all labs ordered are listed, but only abnormal results are displayed) Labs Reviewed  COMPREHENSIVE METABOLIC PANEL WITH GFR - Abnormal; Notable for the following components:      Result Value   Glucose, Bld 139 (*)    BUN 5 (*)    Total Protein 9.1 (*)    All other components within normal limits  URINALYSIS, W/ REFLEX TO CULTURE (INFECTION SUSPECTED) - Abnormal; Notable for the following components:   Specific Gravity, Urine 1.031 (*)    Ketones, ur 20 (*)    Protein, ur 30 (*)    Bacteria, UA RARE (*)    All other components within normal limits  RAPID URINE DRUG SCREEN, HOSP PERFORMED - Abnormal; Notable for the following components:   Opiates POSITIVE (*)    Tetrahydrocannabinol POSITIVE (*)    All other components within normal limits  CBC WITH DIFFERENTIAL/PLATELET  LACTIC ACID, PLASMA  LIPASE, BLOOD  HCG, SERUM, QUALITATIVE  LACTIC ACID, PLASMA    EKG None  Radiology CT ABDOMEN PELVIS W CONTRAST Result Date: 08/04/2023 CLINICAL DATA:  Abdominal pain, acute, nonlocalized No bowel movement in several days, 10 out of 10 abdominal pain with nausea and vomiting. Previous cholecystectomy and previous hyperemesis. EXAM: CT ABDOMEN AND PELVIS WITH CONTRAST TECHNIQUE: Multidetector CT imaging of the abdomen and pelvis was performed using the standard protocol following bolus administration of intravenous contrast. RADIATION DOSE REDUCTION: This exam was performed according to the departmental dose-optimization program which includes automated exposure control, adjustment of the mA and/or kV according to patient size and/or use of iterative reconstruction technique. CONTRAST:  80mL OMNIPAQUE IOHEXOL 300  MG/ML  SOLN COMPARISON:  February 18, 2023, July 24, 2022 FINDINGS: Lower chest: Patchy, nodular ground-glass opacities in the right middle lobe and anterobasal segments of both lower lobes. Hepatobiliary: No mass.Cholecystectomy.No intrahepatic or extrahepatic biliary ductal dilation.The portal veins are patent. Pancreas: No mass or main ductal dilation.No peripancreatic inflammation or fluid collection. Spleen: Normal size. No mass. Adrenals/Urinary Tract: No adrenal masses. No renal mass. No nephrolithiasis or hydronephrosis. The urinary bladder is distended without focal abnormality. Stomach/Bowel: Mild circumferential wall thickening of the distal esophagus. The stomach contains ingested material without focal abnormality. No small bowel wall thickening or inflammation. No small bowel obstruction. The appendix was not visualized. No right lower quadrant or pericecal inflammatory changes to suggest acute appendicitis. Vascular/Lymphatic: No aortic aneurysm. No intraabdominal or pelvic lymphadenopathy. Reproductive: The uterus and ovaries  are within normal limits for patient's age. Dominant follicle in the right ovary measuring 1.4 cm.Small volume free fluid in the pelvis, likely physiologic in a female of this age. Other: No pneumoperitoneum, ascites, or mesenteric inflammation. Musculoskeletal: No acute fracture or destructive lesion.Umbilical adornment. IMPRESSION: 1. Mild circumferential wall thickening of the distal esophagus, which represent an infectious or inflammatory esophagitis, possibly reactive changes due to recent vomiting. 2. Patchy, nodular ground-glass opacities in the lung bases, consistent with an underlying bronchopneumonia. 3. Dominant follicle in the right ovary measuring 1.4 cm. Small volume free fluid in the pelvis, likely physiologic in a female of this age. The Electronically Signed   By: Rance Burrows M.D.   On: 08/04/2023 10:42    Procedures Procedures    Medications Ordered  in ED Medications  doxycycline (VIBRA-TABS) tablet 100 mg (has no administration in time range)  morphine (PF) 4 MG/ML injection 4 mg (4 mg Intravenous Given 08/04/23 0854)  sodium chloride 0.9 % bolus 1,000 mL (0 mLs Intravenous Stopped 08/04/23 1200)  promethazine (PHENERGAN) 25 mg in sodium chloride 0.9 % 50 mL IVPB (0 mg Intravenous Stopped 08/04/23 1011)  iohexol (OMNIPAQUE) 300 MG/ML solution 80 mL (80 mLs Intravenous Contrast Given 08/04/23 1005)  morphine (PF) 4 MG/ML injection 4 mg (4 mg Intravenous Given 08/04/23 1401)  promethazine (PHENERGAN) 25 mg in sodium chloride 0.9 % 50 mL IVPB (25 mg Intravenous New Bag/Given 08/04/23 1406)  sodium chloride 0.9 % bolus 1,000 mL (1,000 mLs Intravenous New Bag/Given 08/04/23 1406)    ED Course/ Medical Decision Making/ A&P                                 Medical Decision Making Amount and/or Complexity of Data Reviewed Labs: ordered. Radiology: ordered.  Risk Prescription drug management.    Theresa Burnett is a 18 y.o. adult with a past medical history significant for previous cholecystectomy, previous cyclic vomiting syndrome and cannabis hyperemesis syndrome who presents with 2 days of worsening nausea, vomiting, and abdominal pain with constipation.  According to patient, she has not had a bowel movement 3 days which is atypical for her.  She reports her abdomen has been hurting and is 10 out of 10 all over right now.  She reports it does not go to her back and she reports nausea and vomiting and dry heaving constantly.  She denies smoking marijuana recently and denies any greasy or spicy foods that could have set this off.  She reports no trauma.  She denies fevers, chills and only has some mild congestion with allergies.  Otherwise denies chest pain shortness of breath or cough.  Denies any dysuria or hematuria.  Denies any vaginal complaints.  Is rolling and pain on the bed.  On exam, lungs clear.  Chest nontender.  Back nontender.  No  CVA tenderness.  Abdomen diffusely tender and I did hear bowel sounds.  Patient has dry mucous membranes and is dry heaving.  Moving all extremities.  No evidence of acute trauma on initial exam.  Given the patient's previous surgical history, report of no bowel movement several days, and 10 out of 10 abdominal pain with dry heaving and nausea and vomiting, I do feel need to get her CT image to rule out bowel obstruction although clinically suspect this is hyperemesis recurrence.  Will give fluids, pain medicine, and nausea medicine and we will make her n.p.o.  Will get screening  labs and urinalysis.  Will get a UDS given history of cannabis hyperemesis.  Anticipate reassessment after workup to determine disposition.  1:39 PM Patient's workup shows inflammation irritation at the distal esophagus and stomach likely due to all the vomiting.  It also shows evidence of bronchopneumonia.  Patient says she has been coughing so I do suspect he may have pneumonia.  Will give a dose of antibiotics here.  Patient is still having nausea and vomiting and feels dehydrated.  She requested more medications.  Will give more fluids, pain medicine, nausea regimen.  If she passes p.o. challenge, dissipate discharge given her reassuring vital signs for outpatient management of nausea and vomiting in the setting of pneumonia.  Patient feeling better after repeat medications.  She was able to eat a sandwich without other symptoms.  Will treat Motrin for pain medicine, nausea medicine, and antibiotics to treat the pneumonia and all of her nausea and vomiting and pain.  She understood return precautions and follow-up instructions and patient was discharged in stable condition.        Final Clinical Impression(s) / ED Diagnoses Final diagnoses:  Pneumonia due to infectious organism, unspecified laterality, unspecified part of lung  Nausea and vomiting, unspecified vomiting type  Abdominal pain, unspecified abdominal  location    Rx / DC Orders ED Discharge Orders          Ordered    doxycycline (VIBRAMYCIN) 100 MG capsule  2 times daily        08/04/23 1528    oxyCODONE-acetaminophen (PERCOCET/ROXICET) 5-325 MG tablet  Every 6 hours PRN        08/04/23 1528    promethazine (PHENERGAN) 25 MG tablet  Every 8 hours PRN        08/04/23 1528            Clinical Impression: 1. Pneumonia due to infectious organism, unspecified laterality, unspecified part of lung   2. Nausea and vomiting, unspecified vomiting type   3. Abdominal pain, unspecified abdominal location     Disposition: Discharge  Condition: Good  I have discussed the results, Dx and Tx plan with the pt(& family if present). He/she/they expressed understanding and agree(s) with the plan. Discharge instructions discussed at great length. Strict return precautions discussed and pt &/or family have verbalized understanding of the instructions. No further questions at time of discharge.    New Prescriptions   DOXYCYCLINE (VIBRAMYCIN) 100 MG CAPSULE    Take 1 capsule (100 mg total) by mouth 2 (two) times daily for 7 days.   OXYCODONE-ACETAMINOPHEN (PERCOCET/ROXICET) 5-325 MG TABLET    Take 1 tablet by mouth every 6 (six) hours as needed for severe pain (pain score 7-10).   PROMETHAZINE (PHENERGAN) 25 MG TABLET    Take 1 tablet (25 mg total) by mouth every 8 (eight) hours as needed for nausea or vomiting.    Follow Up: [facility] [address] [place] [address] [place]  [address] [phone] Schedule an appointment as soon as possible for a visit    [facility] Emergency Department at [facility] [address] [place]  [address] [phone]        Adolfo Granieri, Marine Sia, MD 08/04/23 1529

## 2023-08-04 NOTE — Discharge Instructions (Signed)
 Your history, exam, and workup today is consistent with nausea, vomiting, and pain related to a pneumonia we discovered.  Your other CT scans were reassuring.  Your vital signs are reassuring.  We feel you are safe for discharge home to take the antibiotics pain medicine and nausea medicine.  Please rest and stay hydrated.  Please follow-up with your primary doctor.  If any symptoms change or worsen acutely, please return to the nearest emergency department.

## 2023-08-04 NOTE — ED Triage Notes (Signed)
 Pt BIB EMS coming from home, has had chronic n/v that's been going on for a while. Having abd pain and constipation x 2 days, c/o pain 10/10. Patient vomiting upon arrival  EMS: 136/80, CBG 146, HR 80 Spo2 100% RA

## 2023-08-05 ENCOUNTER — Encounter (HOSPITAL_COMMUNITY): Payer: Self-pay

## 2023-08-05 ENCOUNTER — Emergency Department (HOSPITAL_COMMUNITY)
Admission: EM | Admit: 2023-08-05 | Discharge: 2023-08-05 | Disposition: A | Discharge status: Home / Self Care | Attending: Emergency Medicine | Admitting: Emergency Medicine

## 2023-08-05 ENCOUNTER — Other Ambulatory Visit: Payer: Self-pay

## 2023-08-05 DIAGNOSIS — R112 Nausea with vomiting, unspecified: Secondary | ICD-10-CM | POA: Diagnosis present

## 2023-08-05 DIAGNOSIS — R1013 Epigastric pain: Secondary | ICD-10-CM | POA: Insufficient documentation

## 2023-08-05 LAB — BASIC METABOLIC PANEL WITH GFR
Anion gap: 15 (ref 5–15)
BUN: 11 mg/dL (ref 6–20)
CO2: 21 mmol/L — ABNORMAL LOW (ref 22–32)
Calcium: 9.5 mg/dL (ref 8.9–10.3)
Chloride: 101 mmol/L (ref 98–111)
Creatinine, Ser: 0.33 mg/dL — ABNORMAL LOW (ref 0.44–1.00)
GFR, Estimated: >60 mL/min (ref >60)
Glucose, Bld: 120 mg/dL — ABNORMAL HIGH (ref 70–99)
Potassium: 2.9 mmol/L — ABNORMAL LOW (ref 3.5–5.1)
Sodium: 137 mmol/L (ref 135–145)

## 2023-08-05 LAB — URINALYSIS, ROUTINE W REFLEX MICROSCOPIC
Bacteria, UA: NONE SEEN
Bilirubin Urine: NEGATIVE
Glucose, UA: NEGATIVE mg/dL
Hgb urine dipstick: NEGATIVE
Ketones, ur: 80 mg/dL — AB
Leukocytes,Ua: NEGATIVE
Nitrite: NEGATIVE
Protein, ur: 100 mg/dL — AB
Specific Gravity, Urine: 1.03 (ref 1.005–1.030)
pH: 6 (ref 5.0–8.0)

## 2023-08-05 LAB — CBC
HCT: 46.2 % — ABNORMAL HIGH (ref 36.0–46.0)
Hemoglobin: 14.6 g/dL (ref 12.0–15.0)
MCH: 29.1 pg (ref 26.0–34.0)
MCHC: 31.6 g/dL (ref 30.0–36.0)
MCV: 92.2 fL (ref 80.0–100.0)
Platelets: 455 10*3/uL — ABNORMAL HIGH (ref 150–400)
RBC: 5.01 MIL/uL (ref 3.87–5.11)
RDW: 15.3 % (ref 11.5–15.5)
WBC: 8.6 10*3/uL (ref 4.0–10.5)
nRBC: 0 % (ref 0.0–0.2)

## 2023-08-05 LAB — CBG MONITORING, ED: Glucose-Capillary: 125 mg/dL — ABNORMAL HIGH (ref 70–99)

## 2023-08-05 LAB — RAPID URINE DRUG SCREEN, HOSP PERFORMED
Amphetamines: NOT DETECTED
Barbiturates: NOT DETECTED
Benzodiazepines: NOT DETECTED
Cocaine: NOT DETECTED
Opiates: POSITIVE — AB
Tetrahydrocannabinol: POSITIVE — AB

## 2023-08-05 LAB — HCG, SERUM, QUALITATIVE: Preg, Serum: NEGATIVE

## 2023-08-05 LAB — D-DIMER, QUANTITATIVE: D-Dimer, Quant: <0.27 ug{FEU}/mL (ref 0.00–0.50)

## 2023-08-05 MED ORDER — POTASSIUM CHLORIDE CRYS ER 20 MEQ PO TBCR
40.0000 meq | EXTENDED_RELEASE_TABLET | Freq: Once | ORAL | Status: DC
  Filled 2023-08-05: qty 2

## 2023-08-05 MED ORDER — ONDANSETRON HCL 4 MG/2ML IJ SOLN
4.0000 mg | Freq: Once | INTRAMUSCULAR | Status: AC
  Administered 2023-08-05: 4 mg via INTRAVENOUS
  Filled 2023-08-05: qty 2

## 2023-08-05 MED ORDER — HALOPERIDOL LACTATE 5 MG/ML IJ SOLN
5.0000 mg | Freq: Once | INTRAMUSCULAR | Status: AC
  Administered 2023-08-05: 5 mg via INTRAVENOUS
  Filled 2023-08-05: qty 1

## 2023-08-05 MED ORDER — SODIUM CHLORIDE 0.9 % IV BOLUS
1000.0000 mL | Freq: Once | INTRAVENOUS | Status: AC
  Administered 2023-08-05: 1000 mL via INTRAVENOUS

## 2023-08-05 MED ORDER — ONDANSETRON HCL 4 MG PO TABS: 4.0000 mg | ORAL_TABLET | Freq: Three times a day (TID) | ORAL | 0 refills | Status: DC | PRN

## 2023-08-05 MED ORDER — POTASSIUM CHLORIDE 10 MEQ/100ML IV SOLN
10.0000 meq | Freq: Once | INTRAVENOUS | Status: AC
  Administered 2023-08-05: 10 meq via INTRAVENOUS
  Filled 2023-08-05: qty 100

## 2023-08-05 NOTE — ED Provider Notes (Signed)
 [place] EMERGENCY DEPARTMENT AT [facility] Provider Note   CSN: [id number] Arrival date & time: 08/05/23  8295     History  Chief Complaint  Patient presents with   Loss of Consciousness    Cambelle E Hitt is a 18 y.o. adult.  The history is provided by the patient and medical records. No language interpreter was used.  Loss of Consciousness    18 year old female significant history of cyclic vomiting syndrome, CHS, prior cholecystectomy, last seen in the ED yesterday due to having nausea vomiting diarrhea presenting today for evaluation of a syncopal episode.  Yesterday patient endorsed having abdominal pain with decreased bowel movement for about 3 days.  She also has nausea and vomiting.  Her workup is remarkable for evidence of bronchopneumonia and patient discharged home with supportive care along with antibiotic.  Patient however reports she had a syncopal episode while she was in the shower earlier this morning.  Patient states she feels weak, very nauseous, having vomiting, endorses upper abdominal pain as well as coughing.  She denies any recent marijuana use however UDS from yesterday is positive for marijuana.  No urinary symptoms.  Home Medications Prior to Admission medications   Medication Sig Start Date End Date Taking? Authorizing Provider  dicyclomine (BENTYL) 20 MG tablet Take 1 tablet (20 mg total) by mouth 2 (two) times daily. 07/14/23   Coretha Dew, PA-C  doxycycline (VIBRAMYCIN) 100 MG capsule Take 1 capsule (100 mg total) by mouth 2 (two) times daily for 7 days. 08/04/23 08/11/23  Tegeler, Marine Sia, MD  ondansetron (ZOFRAN-ODT) 4 MG disintegrating tablet Take 1 tablet (4 mg total) by mouth every 8 (eight) hours as needed. 07/14/23   Coretha Dew, PA-C  oxyCODONE-acetaminophen (PERCOCET/ROXICET) 5-325 MG tablet Take 1 tablet by mouth every 6 (six) hours as needed for severe pain (pain score 7-10). 08/04/23   Tegeler, Marine Sia, MD   pantoprazole (PROTONIX) 40 MG tablet Take 1 tablet (40 mg total) by mouth daily. 07/14/23   Coretha Dew, PA-C  promethazine (PHENERGAN) 25 MG tablet Take 1 tablet (25 mg total) by mouth every 8 (eight) hours as needed for nausea or vomiting. 08/04/23   Tegeler, Marine Sia, MD  sucralfate (CARAFATE) 1 g tablet Take 1 tablet (1 g total) by mouth 4 (four) times daily -  with meals and at bedtime. 06/22/23   Ballard Bongo, MD      Allergies    Patient has no allergy information on record.    Review of Systems   Review of Systems  Cardiovascular:  Positive for syncope.  All other systems reviewed and are negative.   Physical Exam Updated Vital Signs BP (!) 137/112   Pulse (!) 115   Temp 98.2 F (36.8 C) (Oral)   Resp 19   SpO2 100%  Physical Exam Constitutional:      Appearance: Delores is well-developed.     Comments: Patient is actively vomiting, appears uncomfortable  HENT:     Head: Atraumatic.  Eyes:     Conjunctiva/sclera: Conjunctivae normal.  Cardiovascular:     Rate and Rhythm: Tachycardia present.     Pulses: Normal pulses.     Heart sounds: Normal heart sounds.  Pulmonary:     Effort: Pulmonary effort is normal.     Breath sounds: Normal breath sounds.  Abdominal:     Tenderness: There is abdominal tenderness (Epigastric tenderness to palpation no guarding no rebound tenderness).  Musculoskeletal:  Cervical back: Normal range of motion and neck supple.  Skin:    Findings: No rash.  Neurological:     Mental Status: Jayni is alert.     ED Results / Procedures / Treatments   Labs (all labs ordered are listed, but only abnormal results are displayed) Labs Reviewed  BASIC METABOLIC PANEL WITH GFR - Abnormal; Notable for the following components:      Result Value   Potassium 2.9 (*)    CO2 21 (*)    Glucose, Bld 120 (*)    Creatinine, Ser 0.33 (*)    All other components within normal limits  CBC - Abnormal; Notable for the following  components:   HCT 46.2 (*)    Platelets 455 (*)    All other components within normal limits  URINALYSIS, ROUTINE W REFLEX MICROSCOPIC - Abnormal; Notable for the following components:   APPearance HAZY (*)    Ketones, ur 80 (*)    Protein, ur 100 (*)    All other components within normal limits  RAPID URINE DRUG SCREEN, HOSP PERFORMED - Abnormal; Notable for the following components:   Opiates POSITIVE (*)    Tetrahydrocannabinol POSITIVE (*)    All other components within normal limits  CBG MONITORING, ED - Abnormal; Notable for the following components:   Glucose-Capillary 125 (*)    All other components within normal limits  HCG, SERUM, QUALITATIVE  D-DIMER, QUANTITATIVE    EKG EKG Interpretation Date/Time:  Sunday August 05 2023 09:55:28 EDT Ventricular Rate:  96 PR Interval:  131 QRS Duration:  72 QT Interval:  373 QTC Calculation: 472 R Axis:   98  Text Interpretation: Sinus rhythm Atrial premature complex Borderline right axis deviation Probable left ventricular hypertrophy Nonspecific T abnrm, anterolateral leads No significant change since last tracing Confirmed by Celesta Coke ([id number]) on 08/05/2023 10:35:43 AM  Radiology CT ABDOMEN PELVIS W CONTRAST Result Date: 08/04/2023 CLINICAL DATA:  Abdominal pain, acute, nonlocalized No bowel movement in several days, 10 out of 10 abdominal pain with nausea and vomiting. Previous cholecystectomy and previous hyperemesis. EXAM: CT ABDOMEN AND PELVIS WITH CONTRAST TECHNIQUE: Multidetector CT imaging of the abdomen and pelvis was performed using the standard protocol following bolus administration of intravenous contrast. RADIATION DOSE REDUCTION: This exam was performed according to the departmental dose-optimization program which includes automated exposure control, adjustment of the mA and/or kV according to patient size and/or use of iterative reconstruction technique. CONTRAST:  80mL OMNIPAQUE IOHEXOL 300 MG/ML  SOLN  COMPARISON:  February 18, 2023, July 24, 2022 FINDINGS: Lower chest: Patchy, nodular ground-glass opacities in the right middle lobe and anterobasal segments of both lower lobes. Hepatobiliary: No mass.Cholecystectomy.No intrahepatic or extrahepatic biliary ductal dilation.The portal veins are patent. Pancreas: No mass or main ductal dilation.No peripancreatic inflammation or fluid collection. Spleen: Normal size. No mass. Adrenals/Urinary Tract: No adrenal masses. No renal mass. No nephrolithiasis or hydronephrosis. The urinary bladder is distended without focal abnormality. Stomach/Bowel: Mild circumferential wall thickening of the distal esophagus. The stomach contains ingested material without focal abnormality. No small bowel wall thickening or inflammation. No small bowel obstruction. The appendix was not visualized. No right lower quadrant or pericecal inflammatory changes to suggest acute appendicitis. Vascular/Lymphatic: No aortic aneurysm. No intraabdominal or pelvic lymphadenopathy. Reproductive: The uterus and ovaries are within normal limits for patient's age. Dominant follicle in the right ovary measuring 1.4 cm.Small volume free fluid in the pelvis, likely physiologic in a female of this age. Other: No pneumoperitoneum, ascites, or  mesenteric inflammation. Musculoskeletal: No acute fracture or destructive lesion.Umbilical adornment. IMPRESSION: 1. Mild circumferential wall thickening of the distal esophagus, which represent an infectious or inflammatory esophagitis, possibly reactive changes due to recent vomiting. 2. Patchy, nodular ground-glass opacities in the lung bases, consistent with an underlying bronchopneumonia. 3. Dominant follicle in the right ovary measuring 1.4 cm. Small volume free fluid in the pelvis, likely physiologic in a female of this age. The Electronically Signed   By: Rance Burrows M.D.   On: 08/04/2023 10:42    Procedures Procedures    Medications Ordered in  ED Medications  potassium chloride SA (KLOR-CON M) CR tablet 40 mEq (40 mEq Oral Patient Refused/Not Given 08/05/23 1206)  potassium chloride 10 mEq in 100 mL IVPB (10 mEq Intravenous New Bag/Given 08/05/23 1209)  sodium chloride 0.9 % bolus 1,000 mL (1,000 mLs Intravenous New Bag/Given 08/05/23 1051)  ondansetron (ZOFRAN) injection 4 mg (4 mg Intravenous Given 08/05/23 1052)  haloperidol lactate (HALDOL) injection 5 mg (5 mg Intravenous Given 08/05/23 1052)  ondansetron (ZOFRAN) injection 4 mg (4 mg Intravenous Given 08/05/23 1304)    ED Course/ Medical Decision Making/ A&P                                 Medical Decision Making Amount and/or Complexity of Data Reviewed Labs: ordered.  Risk Prescription drug management.   BP (!) 137/112   Pulse (!) 115   Temp 98.2 F (36.8 C) (Oral)   Resp 19   SpO2 100%   94:34 AM   18 year old female significant history of cyclic vomiting syndrome, CHS, prior cholecystectomy, last seen in the ED yesterday due to having nausea vomiting diarrhea presenting today for evaluation of a syncopal episode.  Yesterday patient endorsed having abdominal pain with decreased bowel movement for about 3 days.  She also has nausea and vomiting.  Her workup is remarkable for evidence of bronchopneumonia and patient discharged home with supportive care along with antibiotic.  Patient however reports she had a syncopal episode while she was in the shower earlier this morning.  Patient states she feels weak, very nauseous, having vomiting, endorses upper abdominal pain as well as coughing.  She denies any recent marijuana use however UDS from yesterday is positive for marijuana.  No urinary symptoms.  Exam notable for epigastric tenderness, and tachycardia.  Patient is actively vomiting.  EMR reviewed patient was seen in the ER yesterday for similar presentation.  CT scan during the visit shows evidence of gastritis as well as bronchopneumonia.  Patient also had a positive  THC in her UDS.  I suspect her symptoms likely in the setting of cannabinoid hyperemesis syndrome.  Will provide supportive care and will continue to monitor.  Workup initiated.  -Labs ordered, independently viewed and interpreted by me.  Labs remarkable for UA with 80 ketones, IVF given.  K+ 2.9, supplementation given. UDS positive for opiate and THC -The patient was maintained on a cardiac monitor.  I personally viewed and interpreted the cardiac monitored which showed an underlying rhythm of: sinus tachycardia -Imaging including abd/pelvis CT that was obtained from yesterday's visit was viewed by me. -This patient presents to the ED for concern of syncope, this involves an extensive number of treatment options, and is a complaint that carries with it a high risk of complications and morbidity.  The differential diagnosis includes dehydration, CHS, bronchopneumonia, cardiac arrhythmia, hypovolemia, electrolytes imbalance -Co morbidities that complicate  the patient evaluation includes cyclic vomiting syndrome, marijuana abuse -Treatment includes zofran, haldol, IVF, potassium -Reevaluation of the patient after these medicines showed that the patient improved -PCP office notes or outside notes reviewed -Escalation to admission/observation considered: patients feels much better, is comfortable with discharge, and will follow up with PCP -Prescription medication considered, patient comfortable with zofran -Social Determinant of Health considered which includes tobacco use         Final Clinical Impression(s) / ED Diagnoses Final diagnoses:  Intractable nausea and vomiting    Rx / DC Orders ED Discharge Orders          Ordered    ondansetron (ZOFRAN) 4 MG tablet  Every 8 hours PRN        08/05/23 1310              Debbra Fairy, PA-C 08/05/23 1314    Nora Beal, Victoria K, DO 08/05/23 1507

## 2023-08-05 NOTE — Discharge Instructions (Addendum)
 Please take Zofran as needed for your nausea.  Avoid marijuana use as it can triggers your symptoms.  Follow-up closely with your doctor for further care.  Continue to take antibiotic previously prescribed for pneumonia.

## 2023-08-05 NOTE — ED Notes (Signed)
 Pt refused PO potassium, stated it was too large, this RN offered to cut/crush, pt refused PO med  MAR updated

## 2023-08-05 NOTE — ED Triage Notes (Signed)
 Pt BIBA from home for syncopal episode in the shower approximately 30 minutes ago.  Pt denies headache, states lower back pain 8/10 and nausea not controlled with zofran  Pt was seen and treated for pneumonia and released earlier today/yesterday.

## 2023-08-23 ENCOUNTER — Other Ambulatory Visit: Payer: Self-pay | Admitting: Student

## 2023-09-05 ENCOUNTER — Other Ambulatory Visit: Payer: Self-pay

## 2023-09-05 ENCOUNTER — Emergency Department (HOSPITAL_COMMUNITY)
Admission: EM | Admit: 2023-09-05 | Discharge: 2023-09-05 | Disposition: A | Discharge status: Home / Self Care | Attending: Emergency Medicine | Admitting: Emergency Medicine

## 2023-09-05 DIAGNOSIS — R748 Abnormal levels of other serum enzymes: Secondary | ICD-10-CM | POA: Insufficient documentation

## 2023-09-05 DIAGNOSIS — E876 Hypokalemia: Secondary | ICD-10-CM | POA: Insufficient documentation

## 2023-09-05 DIAGNOSIS — R1084 Generalized abdominal pain: Secondary | ICD-10-CM | POA: Diagnosis not present

## 2023-09-05 DIAGNOSIS — R000 Tachycardia, unspecified: Secondary | ICD-10-CM | POA: Diagnosis not present

## 2023-09-05 DIAGNOSIS — R112 Nausea with vomiting, unspecified: Secondary | ICD-10-CM | POA: Insufficient documentation

## 2023-09-05 LAB — CBC WITH DIFFERENTIAL/PLATELET
Abs Immature Granulocytes: 0.03 10*3/uL (ref 0.00–0.07)
Basophils Absolute: 0 10*3/uL (ref 0.0–0.1)
Basophils Relative: 0 %
Eosinophils Absolute: 0 10*3/uL (ref 0.0–0.5)
Eosinophils Relative: 0 %
HCT: 40.6 % (ref 36.0–46.0)
Hemoglobin: 12.8 g/dL (ref 12.0–15.0)
Immature Granulocytes: 0 %
Lymphocytes Relative: 16 %
Lymphs Abs: 1.1 10*3/uL (ref 0.7–4.0)
MCH: 28.6 pg (ref 26.0–34.0)
MCHC: 31.5 g/dL (ref 30.0–36.0)
MCV: 90.6 fL (ref 80.0–100.0)
Monocytes Absolute: 0.3 10*3/uL (ref 0.1–1.0)
Monocytes Relative: 4 %
Neutro Abs: 5.6 10*3/uL (ref 1.7–7.7)
Neutrophils Relative %: 80 %
Platelets: 416 10*3/uL — ABNORMAL HIGH (ref 150–400)
RBC: 4.48 MIL/uL (ref 3.87–5.11)
RDW: 16.1 % — ABNORMAL HIGH (ref 11.5–15.5)
WBC: 7 10*3/uL (ref 4.0–10.5)
nRBC: 0 % (ref 0.0–0.2)

## 2023-09-05 LAB — COMPREHENSIVE METABOLIC PANEL WITH GFR
ALT: 14 U/L (ref 0–44)
AST: 22 U/L (ref 15–41)
Albumin: 4.4 g/dL (ref 3.5–5.0)
Alkaline Phosphatase: 64 U/L (ref 38–126)
Anion gap: 13 (ref 5–15)
BUN: 6 mg/dL (ref 6–20)
CO2: 22 mmol/L (ref 22–32)
Calcium: 9.3 mg/dL (ref 8.9–10.3)
Chloride: 104 mmol/L (ref 98–111)
Creatinine, Ser: 0.5 mg/dL (ref 0.44–1.00)
GFR, Estimated: >60 mL/min (ref >60)
Glucose, Bld: 138 mg/dL — ABNORMAL HIGH (ref 70–99)
Potassium: 3.3 mmol/L — ABNORMAL LOW (ref 3.5–5.1)
Sodium: 139 mmol/L (ref 135–145)
Total Bilirubin: 0.6 mg/dL (ref 0.0–1.2)
Total Protein: 8.8 g/dL — ABNORMAL HIGH (ref 6.5–8.1)

## 2023-09-05 LAB — URINALYSIS, W/ REFLEX TO CULTURE (INFECTION SUSPECTED)
Bacteria, UA: NONE SEEN
Bilirubin Urine: NEGATIVE
Glucose, UA: NEGATIVE mg/dL
Hgb urine dipstick: NEGATIVE
Ketones, ur: 20 mg/dL — AB
Leukocytes,Ua: NEGATIVE
Nitrite: NEGATIVE
Protein, ur: 100 mg/dL — AB
Specific Gravity, Urine: 1.019 (ref 1.005–1.030)
pH: 8 (ref 5.0–8.0)

## 2023-09-05 LAB — LIPASE, BLOOD: Lipase: 134 U/L — ABNORMAL HIGH (ref 11–51)

## 2023-09-05 MED ORDER — POTASSIUM CHLORIDE CRYS ER 20 MEQ PO TBCR
20.0000 meq | EXTENDED_RELEASE_TABLET | Freq: Two times a day (BID) | ORAL | 0 refills | Status: DC
Start: 2023-09-05 — End: 2023-10-26

## 2023-09-05 MED ORDER — ONDANSETRON HCL 4 MG/2ML IJ SOLN
4.0000 mg | Freq: Once | INTRAMUSCULAR | Status: AC
  Administered 2023-09-05: 4 mg via INTRAVENOUS
  Filled 2023-09-05: qty 2

## 2023-09-05 MED ORDER — LORAZEPAM 2 MG/ML IJ SOLN
1.0000 mg | Freq: Once | INTRAMUSCULAR | Status: AC
Start: 2023-09-05 — End: 2023-09-05
  Administered 2023-09-05: 1 mg via INTRAVENOUS
  Filled 2023-09-05: qty 1

## 2023-09-05 MED ORDER — PANTOPRAZOLE SODIUM 40 MG PO TBEC: 40.0000 mg | DELAYED_RELEASE_TABLET | Freq: Every day | ORAL | 0 refills | Status: DC

## 2023-09-05 MED ORDER — OXYCODONE HCL 5 MG PO TABS
5.0000 mg | ORAL_TABLET | Freq: Once | ORAL | Status: AC
  Administered 2023-09-05: 5 mg via ORAL
  Filled 2023-09-05: qty 1

## 2023-09-05 MED ORDER — LACTATED RINGERS IV BOLUS
1000.0000 mL | Freq: Once | INTRAVENOUS | Status: AC
  Administered 2023-09-05: 1000 mL via INTRAVENOUS

## 2023-09-05 MED ORDER — CAPSAICIN 0.025 % EX CREA
TOPICAL_CREAM | Freq: Once | CUTANEOUS | Status: AC
  Filled 2023-09-05: qty 60

## 2023-09-05 MED ORDER — ONDANSETRON 4 MG PO TBDP: 4.0000 mg | ORAL_TABLET | Freq: Three times a day (TID) | ORAL | 0 refills | Status: DC | PRN

## 2023-09-05 MED ORDER — DIPHENHYDRAMINE HCL 50 MG/ML IJ SOLN
25.0000 mg | Freq: Once | INTRAMUSCULAR | Status: AC
  Administered 2023-09-05: 25 mg via INTRAVENOUS
  Filled 2023-09-05: qty 1

## 2023-09-05 MED ORDER — METOCLOPRAMIDE HCL 5 MG/ML IJ SOLN
10.0000 mg | Freq: Once | INTRAMUSCULAR | Status: AC
  Administered 2023-09-05: 10 mg via INTRAVENOUS
  Filled 2023-09-05: qty 2

## 2023-09-05 MED ORDER — POTASSIUM CHLORIDE 10 MEQ/100ML IV SOLN
10.0000 meq | Freq: Once | INTRAVENOUS | Status: AC
Start: 2023-09-05 — End: 2023-09-05
  Administered 2023-09-05: 10 meq via INTRAVENOUS
  Filled 2023-09-05: qty 100

## 2023-09-05 MED ORDER — DROPERIDOL 2.5 MG/ML IJ SOLN
1.2500 mg | Freq: Once | INTRAMUSCULAR | Status: AC
  Administered 2023-09-05: 1.25 mg via INTRAVENOUS
  Filled 2023-09-05: qty 2

## 2023-09-05 MED ORDER — DICYCLOMINE HCL 10 MG/ML IM SOLN
20.0000 mg | Freq: Once | INTRAMUSCULAR | Status: AC
  Administered 2023-09-05: 20 mg via INTRAMUSCULAR
  Filled 2023-09-05: qty 2

## 2023-09-05 NOTE — ED Triage Notes (Signed)
 Pt presents to the ED via EMS from home with complaints of generalized abdominal pain as well as N/V x2 days. Pt states that there is no way that she could be pregnant. AOx4  Pre arrival vitals:  BP 130/80 HR 110 CBG

## 2023-09-05 NOTE — ED Provider Notes (Signed)
**Note Theresa Burnett-Identified via Obfuscation**  [place] EMERGENCY DEPARTMENT AT [facility] Provider Note   CSN: [id number] Arrival date & time: 09/05/23  7829     History  Chief Complaint  Patient presents with   Abdominal Pain   Emesis    Theresa Burnett is a 18 y.o. adult.  HPI     18 year old female with history of cyclic vomiting syndrome, suspected cannabinoid hyperemesis, prior cholecystectomy, who presents with concern for nausea and vomiting.  Reports she has had vomiting for the past 2 days, with about 20 episodes per day of emesis.  Has epigastric abdominal pain.  Denies diarrhea, constipation, dysuria, fever.  Denies new emdications, etoh use. Has used marijuana recently. Similar type of pain and emesis to prior but vomiting more persistent and severe.   Past Medical History:  Diagnosis Date   H. pylori infection      Home Medications Prior to Admission medications   Medication Sig Start Date End Date Taking? Authorizing Provider  ondansetron  (ZOFRAN -ODT) 4 MG disintegrating tablet Take 1 tablet (4 mg total) by mouth every 8 (eight) hours as needed for nausea or vomiting. 09/05/23  Yes Scarlette Currier, MD  pantoprazole  (PROTONIX ) 40 MG tablet Take 1 tablet (40 mg total) by mouth daily for 14 days. 09/05/23 09/19/23 Yes Scarlette Currier, MD  potassium chloride  SA (KLOR-CON  M) 20 MEQ tablet Take 1 tablet (20 mEq total) by mouth 2 (two) times daily for 2 days. 09/05/23 09/07/23 Yes Scarlette Currier, MD  dicyclomine  (BENTYL ) 20 MG tablet Take 1 tablet (20 mg total) by mouth 2 (two) times daily. 07/14/23   Coretha Dew, PA-C  ondansetron  (ZOFRAN ) 4 MG tablet Take 1 tablet (4 mg total) by mouth every 8 (eight) hours as needed for nausea or vomiting. 08/05/23   Debbra Fairy, PA-C  oxyCODONE -acetaminophen  (PERCOCET/ROXICET) 5-325 MG tablet Take 1 tablet by mouth every 6 (six) hours as needed for severe pain (pain score 7-10). 08/04/23   Tegeler, Marine Sia, MD  promethazine  (PHENERGAN ) 25 MG tablet Take 1  tablet (25 mg total) by mouth every 8 (eight) hours as needed for nausea or vomiting. 08/04/23   Tegeler, Marine Sia, MD  sucralfate  (CARAFATE ) 1 g tablet Take 1 tablet (1 g total) by mouth 4 (four) times daily -  with meals and at bedtime. 06/22/23   Ballard Bongo, MD      Allergies    Patient has no known allergies.    Review of Systems   Review of Systems  Physical Exam Updated Vital Signs BP (!) 153/108   Pulse (!) 116   Temp 98.3 F (36.8 C) (Oral)   Resp 20   SpO2 100%  Physical Exam Vitals and nursing note reviewed.  Constitutional:      General: Theresa Burnett is not in acute distress.    Appearance: Theresa Burnett is well-developed. Theresa Burnett is not diaphoretic.  HENT:     Head: Normocephalic and atraumatic.  Eyes:     Conjunctiva/sclera: Conjunctivae normal.  Cardiovascular:     Rate and Rhythm: Regular rhythm. Tachycardia present.     Heart sounds: Normal heart sounds. No murmur heard.    No friction rub. No gallop.  Pulmonary:     Effort: Pulmonary effort is normal. No respiratory distress.     Breath sounds: Normal breath sounds. No wheezing or rales.  Abdominal:     General: There is no distension.     Palpations: Abdomen is soft.     Tenderness: There is abdominal tenderness (mild epigastric).  There is no guarding.  Musculoskeletal:        General: No tenderness.     Cervical back: Normal range of motion.  Skin:    General: Skin is warm and dry.     Findings: No erythema or rash.  Neurological:     Mental Status: Theresa Burnett is alert and oriented to person, place, and time.     ED Results / Procedures / Treatments   Labs (all labs ordered are listed, but only abnormal results are displayed) Labs Reviewed  CBC WITH DIFFERENTIAL/PLATELET - Abnormal; Notable for the following components:      Result Value   RDW 16.1 (*)    Platelets 416 (*)    All other components within normal limits  COMPREHENSIVE METABOLIC PANEL WITH GFR - Abnormal; Notable for the  following components:   Potassium 3.3 (*)    Glucose, Bld 138 (*)    Total Protein 8.8 (*)    All other components within normal limits  LIPASE, BLOOD - Abnormal; Notable for the following components:   Lipase 134 (*)    All other components within normal limits  URINALYSIS, W/ REFLEX TO CULTURE (INFECTION SUSPECTED) - Abnormal; Notable for the following components:   APPearance CLOUDY (*)    Ketones, ur 20 (*)    Protein, ur 100 (*)    All other components within normal limits    EKG EKG Interpretation Date/Time:  Wednesday Sep 05 2023 09:39:09 EDT Ventricular Rate:  95 PR Interval:  151 QRS Duration:  70 QT Interval:  363 QTC Calculation: 457 R Axis:   76  Text Interpretation: Sinus arrhythmia Biatrial enlargement No significant change since last tracing Confirmed by Scarlette Currier ([id number]) on 09/05/2023 9:59:53 AM  Radiology No results found.  Procedures Procedures    Medications Ordered in ED Medications  lactated ringers  bolus 1,000 mL (0 mLs Intravenous Stopped 09/05/23 1137)  droperidol  (INAPSINE ) 2.5 MG/ML injection 1.25 mg (1.25 mg Intravenous Given 09/05/23 0859)  potassium chloride  10 mEq in 100 mL IVPB (0 mEq Intravenous Stopped 09/05/23 1202)  lactated ringers  bolus 1,000 mL (0 mLs Intravenous Stopped 09/05/23 1137)  dicyclomine  (BENTYL ) injection 20 mg (20 mg Intramuscular Given 09/05/23 1041)  capsaicin  (ZOSTRIX) 0.025 % cream ( Topical Given 09/05/23 1054)  metoCLOPramide  (REGLAN ) injection 10 mg (10 mg Intravenous Given 09/05/23 1033)  diphenhydrAMINE  (BENADRYL ) injection 25 mg (25 mg Intravenous Given 09/05/23 1039)  LORazepam  (ATIVAN ) injection 1 mg (1 mg Intravenous Given 09/05/23 1246)  oxyCODONE  (Oxy IR/ROXICODONE ) immediate release tablet 5 mg (5 mg Oral Given 09/05/23 1523)  ondansetron  (ZOFRAN ) injection 4 mg (4 mg Intravenous Given 09/05/23 1523)    ED Course/ Medical Decision Making/ A&P                                   18 year old female with  history of cyclic vomiting syndrome, suspected cannabinoid hyperemesis, prior cholecystectomy, who presents with concern for nausea and vomiting.  DDx includes pancreatitis, hepatitis, PUD, gastroparesis, choledocolithiasis, pyelonephritis, cyclic vomiting, cannabinoid hyperemesis.  Labs completed and personally evaluated interpreted by me show no anemia, no leukocytosis, CMP with mild hypokalemia without other clinically significant electrolyte abnormalities, no transaminitis.  Lipase is elevated to 134, mildly but less than 3 times upper limit of normal to define pancreatitis.  UA without UTI.    Low suspicion for SBO, appendicitis, diverticulitis, perforation overall.  Given droperidol , reglan , benadryl , ativan , fluid and  K. Has continued nausea and vomiting but is requesting discharge due to family commitment. Discussed could try other medications, hydration, consider imaging if symptoms continue however requests leaving.  Given rx for zofran  and PPI.  Patient discharged in stable condition with understanding of reasons to return.           Final Clinical Impression(s) / ED Diagnoses Final diagnoses:  Nausea and vomiting, unspecified vomiting type    Rx / DC Orders ED Discharge Orders          Ordered    ondansetron  (ZOFRAN -ODT) 4 MG disintegrating tablet  Every 8 hours PRN        09/05/23 1508    pantoprazole  (PROTONIX ) 40 MG tablet  Daily        09/05/23 1508    potassium chloride  SA (KLOR-CON  M) 20 MEQ tablet  2 times daily        09/05/23 1508              Scarlette Currier, MD 09/05/23 2313

## 2023-10-02 ENCOUNTER — Encounter: Payer: Self-pay | Admitting: *Deleted

## 2023-10-25 ENCOUNTER — Encounter (HOSPITAL_COMMUNITY): Payer: Self-pay | Admitting: Emergency Medicine

## 2023-10-25 ENCOUNTER — Other Ambulatory Visit: Payer: Self-pay

## 2023-10-25 ENCOUNTER — Emergency Department (HOSPITAL_COMMUNITY)
Admission: EM | Admit: 2023-10-25 | Discharge: 2023-10-25 | Disposition: A | Discharge status: Home / Self Care | Attending: Emergency Medicine | Admitting: Emergency Medicine

## 2023-10-25 DIAGNOSIS — R112 Nausea with vomiting, unspecified: Secondary | ICD-10-CM | POA: Insufficient documentation

## 2023-10-25 DIAGNOSIS — E876 Hypokalemia: Secondary | ICD-10-CM | POA: Diagnosis not present

## 2023-10-25 LAB — URINALYSIS, ROUTINE W REFLEX MICROSCOPIC
Bilirubin Urine: NEGATIVE
Glucose, UA: NEGATIVE mg/dL
Hgb urine dipstick: NEGATIVE
Ketones, ur: 20 mg/dL — AB
Leukocytes,Ua: NEGATIVE
Nitrite: NEGATIVE
Protein, ur: NEGATIVE mg/dL
Specific Gravity, Urine: 1.012 (ref 1.005–1.030)
pH: 8 (ref 5.0–8.0)

## 2023-10-25 LAB — CBC
HCT: 42.2 % (ref 36.0–46.0)
Hemoglobin: 13.2 g/dL (ref 12.0–15.0)
MCH: 28 pg (ref 26.0–34.0)
MCHC: 31.3 g/dL (ref 30.0–36.0)
MCV: 89.4 fL (ref 80.0–100.0)
Platelets: 331 10*3/uL (ref 150–400)
RBC: 4.72 MIL/uL (ref 3.87–5.11)
RDW: 17.4 % — ABNORMAL HIGH (ref 11.5–15.5)
WBC: 8.1 10*3/uL (ref 4.0–10.5)
nRBC: 0 % (ref 0.0–0.2)

## 2023-10-25 LAB — COMPREHENSIVE METABOLIC PANEL WITH GFR
ALT: 9 U/L (ref 0–44)
AST: 24 U/L (ref 15–41)
Albumin: 4.5 g/dL (ref 3.5–5.0)
Alkaline Phosphatase: 61 U/L (ref 38–126)
Anion gap: 14 (ref 5–15)
BUN: 5 mg/dL — ABNORMAL LOW (ref 6–20)
CO2: 23 mmol/L (ref 22–32)
Calcium: 9.9 mg/dL (ref 8.9–10.3)
Chloride: 106 mmol/L (ref 98–111)
Creatinine, Ser: 0.59 mg/dL (ref 0.44–1.00)
GFR, Estimated: >60 mL/min (ref >60)
Glucose, Bld: 121 mg/dL — ABNORMAL HIGH (ref 70–99)
Potassium: 3.2 mmol/L — ABNORMAL LOW (ref 3.5–5.1)
Sodium: 143 mmol/L (ref 135–145)
Total Bilirubin: 0.7 mg/dL (ref 0.0–1.2)
Total Protein: 8.3 g/dL — ABNORMAL HIGH (ref 6.5–8.1)

## 2023-10-25 LAB — LIPASE, BLOOD: Lipase: 23 U/L (ref 11–51)

## 2023-10-25 LAB — HCG, SERUM, QUALITATIVE: Preg, Serum: NEGATIVE

## 2023-10-25 MED ORDER — LORAZEPAM 2 MG/ML IJ SOLN
1.0000 mg | Freq: Once | INTRAMUSCULAR | Status: AC
  Administered 2023-10-25: 1 mg via INTRAVENOUS
  Filled 2023-10-25: qty 1

## 2023-10-25 MED ORDER — KETOROLAC TROMETHAMINE 15 MG/ML IJ SOLN
15.0000 mg | Freq: Once | INTRAMUSCULAR | Status: AC
  Administered 2023-10-25: 15 mg via INTRAVENOUS
  Filled 2023-10-25: qty 1

## 2023-10-25 MED ORDER — ONDANSETRON 4 MG PO TBDP: 4.0000 mg | ORAL_TABLET | Freq: Three times a day (TID) | ORAL | 0 refills | Status: DC | PRN

## 2023-10-25 MED ORDER — SODIUM CHLORIDE 0.9 % IV BOLUS
1000.0000 mL | Freq: Once | INTRAVENOUS | Status: AC
  Administered 2023-10-25: 1000 mL via INTRAVENOUS

## 2023-10-25 MED ORDER — PROMETHAZINE HCL 25 MG RE SUPP: 25.0000 mg | Freq: Four times a day (QID) | RECTAL | 0 refills | Status: DC | PRN

## 2023-10-25 MED ORDER — POTASSIUM CHLORIDE 10 MEQ/100ML IV SOLN
10.0000 meq | Freq: Once | INTRAVENOUS | Status: AC
  Administered 2023-10-25: 10 meq via INTRAVENOUS
  Filled 2023-10-25: qty 100

## 2023-10-25 MED ORDER — DROPERIDOL 2.5 MG/ML IJ SOLN
1.2500 mg | Freq: Once | INTRAMUSCULAR | Status: AC
  Administered 2023-10-25: 1.25 mg via INTRAVENOUS
  Filled 2023-10-25: qty 2

## 2023-10-25 NOTE — ED Provider Notes (Signed)
 [place] EMERGENCY DEPARTMENT AT [facility] Provider Note   CSN: [id number] Arrival date & time: 10/25/23  1702     Patient presents with: Abdominal Pain   Theresa Burnett is a 17 y.o. adult.   18 year old female with prior medical history as detailed below presents for evaluation.  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  Patient reports acute onset of nausea, vomiting, diffuse abdominal discomfort.  Symptoms began this morning.  She reports that she did not have anything at home to take for her symptoms.  She denies fever.  She denies diarrhea.  She denies chest pain or shortness of breath.  The history is provided by the patient and medical records.       Prior to Admission medications   Medication Sig Start Date End Date Taking? Authorizing Provider  dicyclomine  (BENTYL ) 20 MG tablet Take 1 tablet (20 mg total) by mouth 2 (two) times daily. 07/14/23   Jarold Olam HERO, PA-C  ondansetron  (ZOFRAN ) 4 MG tablet Take 1 tablet (4 mg total) by mouth every 8 (eight) hours as needed for nausea or vomiting. 08/05/23   Nivia Colon, PA-C  ondansetron  (ZOFRAN -ODT) 4 MG disintegrating tablet Take 1 tablet (4 mg total) by mouth every 8 (eight) hours as needed for nausea or vomiting. 09/05/23   Dreama Longs, MD  oxyCODONE -acetaminophen  (PERCOCET/ROXICET) 5-325 MG tablet Take 1 tablet by mouth every 6 (six) hours as needed for severe pain (pain score 7-10). 08/04/23   Tegeler, Lonni PARAS, MD  pantoprazole  (PROTONIX ) 40 MG tablet Take 1 tablet (40 mg total) by mouth daily for 14 days. 09/05/23 09/19/23  Dreama Longs, MD  potassium chloride  SA (KLOR-CON  M) 20 MEQ tablet Take 1 tablet (20 mEq total) by mouth 2 (two) times daily for 2 days. 09/05/23 09/07/23  Dreama Longs, MD  promethazine  (PHENERGAN ) 25 MG tablet Take 1 tablet (25 mg total) by mouth every 8 (eight) hours as needed for nausea or vomiting. 08/04/23   Tegeler, Lonni PARAS,  MD  sucralfate  (CARAFATE ) 1 g tablet Take 1 tablet (1 g total) by mouth 4 (four) times daily -  with meals and at bedtime. 06/22/23   Haze Lonni PARAS, MD    Allergies: Patient has no known allergies.    Review of Systems  All other systems reviewed and are negative.   Updated Vital Signs BP (!) 143/106   Pulse (!) 104   Temp (!) 97.4 F (36.3 C) (Oral)   Resp 20   SpO2 100%   Physical Exam Vitals and nursing note reviewed.  Constitutional:      General: Theresa Burnett is not in acute distress.    Appearance: Normal appearance. Theresa Burnett is well-developed.  HENT:     Head: Normocephalic and atraumatic.  Eyes:     Conjunctiva/sclera: Conjunctivae normal.     Pupils: Pupils are equal, round, and reactive to light.  Cardiovascular:     Rate and Rhythm: Normal rate and regular rhythm.     Heart sounds: Normal heart sounds.  Pulmonary:     Effort: Pulmonary effort is normal. No respiratory distress.     Breath sounds: Normal breath sounds.  Abdominal:     General: There is no distension.     Palpations: Abdomen is soft.     Tenderness: There is no abdominal tenderness.  Musculoskeletal:        General: No deformity. Normal range of motion.     Cervical back: Normal range of motion and neck  supple.  Skin:    General: Skin is warm and dry.  Neurological:     General: No focal deficit present.     Mental Status: [name] is alert and oriented to person, place, and time.     (all labs ordered are listed, but only abnormal results are displayed) Labs Reviewed  COMPREHENSIVE METABOLIC PANEL WITH GFR - Abnormal; Notable for the following components:      Result Value   Potassium 3.2 (*)    Glucose, Bld 121 (*)    BUN 5 (*)    Total Protein 8.3 (*)    All other components within normal limits  CBC - Abnormal; Notable for the following components:   RDW 17.4 (*)    All other components within normal limits  LIPASE, BLOOD  URINALYSIS, ROUTINE W REFLEX MICROSCOPIC  HCG,  SERUM, QUALITATIVE    EKG: None  Radiology: No results found.   Procedures   Medications Ordered in the ED  sodium chloride  0.9 % bolus 1,000 mL (has no administration in time range)  LORazepam  (ATIVAN ) injection 1 mg (has no administration in time range)  droperidol  (INAPSINE ) 2.5 MG/ML injection 1.25 mg (has no administration in time range)  potassium chloride  10 mEq in 100 mL IVPB (has no administration in time range)                                    Medical Decision Making Amount and/or Complexity of Data Reviewed Labs: ordered.  Risk Prescription drug management.    Medical Screen Complete  This patient presented to the ED with complaint of nausea and vomiting.  This complaint involves an extensive number of treatment options. The initial differential diagnosis includes, but is not limited to, metabolic abnormality, etc.  This presentation is: Acute, Chronic, Self-Limited, Previously Undiagnosed, Uncertain Prognosis, Complicated, Systemic Symptoms, and Threat to Life/Bodily Function   Patient presents with nausea and vomiting.  Patient with well-documented history of same.  Obtained screening labs are without significant acute abnormality.  With medication and IV fluids the patient feels much improved.  She is appropriate for discharge home.  Importance of close follow-up stressed.  Strict return precautions given understood.  Additional history obtained:  External records from outside sources obtained and reviewed including prior ED visits and prior Inpatient records.    Problem List / ED Course:  Nausea and vomiting  Disposition:  After consideration of the diagnostic results and the patients response to treatment, I feel that the patent would benefit from close outpatient followup.       Final diagnoses:  Nausea and vomiting, unspecified vomiting type    ED Discharge Orders          Ordered    ondansetron  (ZOFRAN -ODT) 4 MG  disintegrating tablet  Every 8 hours PRN        10/25/23 2201    promethazine  (PHENERGAN ) 25 MG suppository  Every 6 hours PRN        10/25/23 2201               Laurice Maude BROCKS, MD 10/25/23 2214

## 2023-10-25 NOTE — Discharge Instructions (Addendum)
 Return for any problem.  ?

## 2023-10-25 NOTE — ED Triage Notes (Signed)
 Patient BIB EMS from home c/o abdominal pain x 2 days. Patient report nausea and vomiting x 3 today.  4mg  IM Zofran  given by EMS. Patient denies fever at home.   CBG 124 BP 140/100 HR 98 RR 20 O2sat 100% on RA

## 2023-10-26 ENCOUNTER — Emergency Department (HOSPITAL_COMMUNITY)

## 2023-10-26 ENCOUNTER — Emergency Department (HOSPITAL_COMMUNITY)
Admission: EM | Admit: 2023-10-26 | Discharge: 2023-10-26 | Disposition: A | Discharge status: Home / Self Care | Attending: Emergency Medicine | Admitting: Emergency Medicine

## 2023-10-26 DIAGNOSIS — R112 Nausea with vomiting, unspecified: Secondary | ICD-10-CM | POA: Diagnosis present

## 2023-10-26 DIAGNOSIS — R000 Tachycardia, unspecified: Secondary | ICD-10-CM | POA: Diagnosis not present

## 2023-10-26 DIAGNOSIS — E876 Hypokalemia: Secondary | ICD-10-CM | POA: Insufficient documentation

## 2023-10-26 DIAGNOSIS — R10819 Abdominal tenderness, unspecified site: Secondary | ICD-10-CM | POA: Insufficient documentation

## 2023-10-26 LAB — COMPREHENSIVE METABOLIC PANEL WITH GFR
ALT: 11 U/L (ref 0–44)
AST: 22 U/L (ref 15–41)
Albumin: 4.7 g/dL (ref 3.5–5.0)
Alkaline Phosphatase: 63 U/L (ref 38–126)
Anion gap: 14 (ref 5–15)
BUN: 9 mg/dL (ref 6–20)
CO2: 22 mmol/L (ref 22–32)
Calcium: 9.8 mg/dL (ref 8.9–10.3)
Chloride: 105 mmol/L (ref 98–111)
Creatinine, Ser: 0.59 mg/dL (ref 0.44–1.00)
GFR, Estimated: >60 mL/min (ref >60)
Glucose, Bld: 124 mg/dL — ABNORMAL HIGH (ref 70–99)
Potassium: 3 mmol/L — ABNORMAL LOW (ref 3.5–5.1)
Sodium: 141 mmol/L (ref 135–145)
Total Bilirubin: 1 mg/dL (ref 0.0–1.2)
Total Protein: 8.6 g/dL — ABNORMAL HIGH (ref 6.5–8.1)

## 2023-10-26 LAB — HCG, SERUM, QUALITATIVE: Preg, Serum: NEGATIVE

## 2023-10-26 LAB — CBC WITH DIFFERENTIAL/PLATELET
Abs Immature Granulocytes: 0.02 K/uL (ref 0.00–0.07)
Basophils Absolute: 0 K/uL (ref 0.0–0.1)
Basophils Relative: 0 %
Eosinophils Absolute: 0 K/uL (ref 0.0–0.5)
Eosinophils Relative: 0 %
HCT: 39.2 % (ref 36.0–46.0)
Hemoglobin: 12.6 g/dL (ref 12.0–15.0)
Immature Granulocytes: 0 %
Lymphocytes Relative: 13 %
Lymphs Abs: 1.2 K/uL (ref 0.7–4.0)
MCH: 28.1 pg (ref 26.0–34.0)
MCHC: 32.1 g/dL (ref 30.0–36.0)
MCV: 87.3 fL (ref 80.0–100.0)
Monocytes Absolute: 0.8 K/uL (ref 0.1–1.0)
Monocytes Relative: 8 %
Neutro Abs: 7 K/uL (ref 1.7–7.7)
Neutrophils Relative %: 79 %
Platelets: 349 K/uL (ref 150–400)
RBC: 4.49 MIL/uL (ref 3.87–5.11)
RDW: 17.8 % — ABNORMAL HIGH (ref 11.5–15.5)
WBC: 9 K/uL (ref 4.0–10.5)
nRBC: 0 % (ref 0.0–0.2)

## 2023-10-26 LAB — LIPASE, BLOOD: Lipase: 22 U/L (ref 11–51)

## 2023-10-26 MED ORDER — ONDANSETRON 4 MG PO TBDP: 4.0000 mg | ORAL_TABLET | Freq: Three times a day (TID) | ORAL | 0 refills | Status: DC | PRN

## 2023-10-26 MED ORDER — POTASSIUM CHLORIDE CRYS ER 20 MEQ PO TBCR: 20.0000 meq | EXTENDED_RELEASE_TABLET | Freq: Every day | ORAL | 0 refills | Status: DC

## 2023-10-26 MED ORDER — PANTOPRAZOLE SODIUM 40 MG IV SOLR
40.0000 mg | Freq: Once | INTRAVENOUS | Status: AC
  Administered 2023-10-26: 40 mg via INTRAVENOUS
  Filled 2023-10-26: qty 10

## 2023-10-26 MED ORDER — ONDANSETRON HCL 4 MG/2ML IJ SOLN
4.0000 mg | Freq: Once | INTRAMUSCULAR | Status: AC
  Administered 2023-10-26: 4 mg via INTRAVENOUS
  Filled 2023-10-26: qty 2

## 2023-10-26 MED ORDER — SODIUM CHLORIDE 0.9 % IV BOLUS
500.0000 mL | Freq: Once | INTRAVENOUS | Status: AC
  Administered 2023-10-26: 500 mL via INTRAVENOUS

## 2023-10-26 MED ORDER — IOHEXOL 300 MG/ML  SOLN
100.0000 mL | Freq: Once | INTRAMUSCULAR | Status: AC | PRN
  Administered 2023-10-26: 100 mL via INTRAVENOUS

## 2023-10-26 MED ORDER — MORPHINE SULFATE (PF) 4 MG/ML IV SOLN
4.0000 mg | Freq: Once | INTRAVENOUS | Status: AC
  Administered 2023-10-26: 4 mg via INTRAVENOUS
  Filled 2023-10-26: qty 1

## 2023-10-26 MED ORDER — SODIUM CHLORIDE 0.9 % IV BOLUS
1000.0000 mL | Freq: Once | INTRAVENOUS | Status: AC
  Administered 2023-10-26: 1000 mL via INTRAVENOUS

## 2023-10-26 MED ORDER — DROPERIDOL 2.5 MG/ML IJ SOLN
1.2500 mg | Freq: Once | INTRAMUSCULAR | Status: AC
  Administered 2023-10-26: 1.25 mg via INTRAVENOUS
  Filled 2023-10-26: qty 2

## 2023-10-26 NOTE — ED Triage Notes (Signed)
 Per EMS from home. Abdominal pain. N/V for past day.  138/100 HR 135 98 on RA 98.4 T

## 2023-10-26 NOTE — Discharge Instructions (Addendum)
 Glad you are feeling better.  Please follow-up with your primary care provider.  Seek emergency care if experiencing any new or worsening symptoms.

## 2023-10-26 NOTE — ED Provider Notes (Cosign Needed Addendum)
 [place] EMERGENCY DEPARTMENT AT [facility] Provider Note   CSN: [id number] Arrival date & time: 10/26/23  1026     Patient presents with: Abdominal Pain, Nausea, and Emesis   Theresa Burnett is a 18 y.o. adult with PMHx cannabis hyperemesis, cyclic vomiting syndrome, IDA, cholecystectomy who presents to ED concerned for generalized abdominal pain, nausea, and vomiting since yesterday morning. Patient with reassuring workup yesterday in ED and eventually discharged home. Patient stating that she is unsure if she was discharged with any symptomatic medications and has not had any medication at home for her symptoms.  Patient stating her pain seems to be strongest in the epigastric area.  Denies fever, cough, diarrhea, hematemesis, hematochezia, dysuria, hematuria.    Abdominal Pain Associated symptoms: vomiting   Emesis Associated symptoms: abdominal pain        Prior to Admission medications   Medication Sig Start Date End Date Taking? Authorizing Provider  dicyclomine  (BENTYL ) 20 MG tablet Take 1 tablet (20 mg total) by mouth 2 (two) times daily. 07/14/23   Jarold Olam HERO, PA-C  ondansetron  (ZOFRAN ) 4 MG tablet Take 1 tablet (4 mg total) by mouth every 8 (eight) hours as needed for nausea or vomiting. 08/05/23   Nivia Colon, PA-C  ondansetron  (ZOFRAN -ODT) 4 MG disintegrating tablet Take 1 tablet (4 mg total) by mouth every 8 (eight) hours as needed for nausea or vomiting. 10/26/23   Hoy Nidia FALCON, PA-C  oxyCODONE -acetaminophen  (PERCOCET/ROXICET) 5-325 MG tablet Take 1 tablet by mouth every 6 (six) hours as needed for severe pain (pain score 7-10). 08/04/23   Tegeler, Lonni PARAS, MD  pantoprazole  (PROTONIX ) 40 MG tablet Take 1 tablet (40 mg total) by mouth daily for 14 days. 09/05/23 09/19/23  Dreama Longs, MD  potassium chloride  SA (KLOR-CON  M) 20 MEQ tablet Take 1 tablet (20 mEq total) by mouth 2 (two) times daily for 2 days. 09/05/23 09/07/23  Dreama Longs,  MD  promethazine  (PHENERGAN ) 25 MG suppository Place 1 suppository (25 mg total) rectally every 6 (six) hours as needed for nausea or vomiting. 10/25/23   Laurice Maude BROCKS, MD  promethazine  (PHENERGAN ) 25 MG tablet Take 1 tablet (25 mg total) by mouth every 8 (eight) hours as needed for nausea or vomiting. 08/04/23   Tegeler, Lonni PARAS, MD  sucralfate  (CARAFATE ) 1 g tablet Take 1 tablet (1 g total) by mouth 4 (four) times daily -  with meals and at bedtime. 06/22/23   Haze Lonni PARAS, MD    Allergies: Patient has no known allergies.    Review of Systems  Gastrointestinal:  Positive for abdominal pain and vomiting.    Updated Vital Signs BP 118/86 (BP Location: Left Arm)   Pulse (!) 109   Temp 97.8 F (36.6 C) (Oral)   Resp (!) 25   SpO2 100%   Physical Exam Vitals and nursing note reviewed.  Constitutional:      General: [name] is in acute distress.     Appearance: [name] is not ill-appearing or toxic-appearing.  HENT:     Head: Normocephalic and atraumatic.     Mouth/Throat:     Mouth: Mucous membranes are moist.  Eyes:     General: No scleral icterus.       Right eye: No discharge.        Left eye: No discharge.     Conjunctiva/sclera: Conjunctivae normal.  Cardiovascular:     Rate and Rhythm: Regular rhythm. Tachycardia present.     Pulses: Normal pulses.  Heart sounds: Normal heart sounds. No murmur heard. Pulmonary:     Effort: Pulmonary effort is normal. No respiratory distress.     Breath sounds: Normal breath sounds. No wheezing, rhonchi or rales.  Abdominal:     General: Abdomen is flat. Bowel sounds are normal. There is no distension.     Palpations: Abdomen is soft. There is no mass.     Tenderness: There is abdominal tenderness.     Comments: Abdominal tenderness most severe in epigastric area.   Musculoskeletal:     Right lower leg: No edema.     Left lower leg: No edema.  Skin:    General: Skin is warm and dry.     Findings: No rash.   Neurological:     General: No focal deficit present.     Mental Status: Marigny is alert and oriented to person, place, and time. Mental status is at baseline.  Psychiatric:        Mood and Affect: Mood normal.        Behavior: Behavior normal.     (all labs ordered are listed, but only abnormal results are displayed) Labs Reviewed  COMPREHENSIVE METABOLIC PANEL WITH GFR - Abnormal; Notable for the following components:      Result Value   Potassium 3.0 (*)    Glucose, Bld 124 (*)    Total Protein 8.6 (*)    All other components within normal limits  CBC WITH DIFFERENTIAL/PLATELET - Abnormal; Notable for the following components:   RDW 17.8 (*)    All other components within normal limits  LIPASE, BLOOD  HCG, SERUM, QUALITATIVE    EKG: None  Radiology: CT ABDOMEN PELVIS W CONTRAST Result Date: 10/26/2023 CLINICAL DATA:  Abdominal pain, acute nonlocalized EXAM: CT ABDOMEN AND PELVIS WITH CONTRAST TECHNIQUE: Multidetector CT imaging of the abdomen and pelvis was performed using the standard protocol following bolus administration of intravenous contrast. RADIATION DOSE REDUCTION: This exam was performed according to the departmental dose-optimization program which includes automated exposure control, adjustment of the mA and/or kV according to patient size and/or use of iterative reconstruction technique. CONTRAST:  100mL OMNIPAQUE  IOHEXOL  300 MG/ML  SOLN COMPARISON:  None Available. FINDINGS: Lower chest: Lung bases are clear. Hepatobiliary: No focal hepatic lesion. Postcholecystectomy. No biliary duct dilatation. Common bile duct is normal. Pancreas: Pancreas is normal. No ductal dilatation. No pancreatic inflammation. Spleen: Normal spleen Adrenals/urinary tract: Adrenal glands and kidneys are normal. The ureters and bladder normal. Stomach/Bowel: Stomach, small bowel, appendix, and cecum are normal. Appendix identified on image 54/2. The colon and rectosigmoid colon are normal.  Vascular/Lymphatic: Abdominal aorta is normal caliber. No periportal or retroperitoneal adenopathy. No pelvic adenopathy. Reproductive: Uterus and adnexa unremarkable. Moderate volume of free fluid in the posterior cul-de-sac. Fluid organized in simple Other: No organized fluid collections.  No inflammation Musculoskeletal: No aggressive osseous lesion. IMPRESSION: 1. No acute findings in the abdomen pelvis. 2. Normal appendix. 3. Moderate volume of free fluid in the posterior cul-de-sac is likely physiologic. Electronically Signed   By: Jackquline Boxer M.D.   On: 10/26/2023 12:26     Procedures   Medications Ordered in the ED  sodium chloride  0.9 % bolus 1,000 mL (1,000 mLs Intravenous New Bag/Given 10/26/23 1155)  ondansetron  (ZOFRAN ) injection 4 mg (4 mg Intravenous Given 10/26/23 1156)  morphine  (PF) 4 MG/ML injection 4 mg (4 mg Intravenous Given 10/26/23 1156)  pantoprazole  (PROTONIX ) injection 40 mg (40 mg Intravenous Given 10/26/23 1156)  iohexol  (OMNIPAQUE ) 300 MG/ML  solution 100 mL (100 mLs Intravenous Contrast Given 10/26/23 1203)  droperidol  (INAPSINE ) 2.5 MG/ML injection 1.25 mg (1.25 mg Intravenous Given 10/26/23 1355)                                    Medical Decision Making Amount and/or Complexity of Data Reviewed Labs: ordered. Radiology: ordered.  Risk Prescription drug management.    This patient presents to the ED for concern of abdominal pain, this involves an extensive number of treatment options, and is a complaint that carries with it a high risk of complications and morbidity.  The differential diagnosis includes gastroenteritis, colitis, small bowel obstruction, appendicitis, cholecystitis, pancreatitis, nephrolithiasis, UTI, pyelonephritis   Co morbidities that complicate the patient evaluation  cannabis hyperemesis, cyclic vomiting syndrome, IDA, cholecystectomy    Additional history obtained:  Dr. Howell PCP UA obtained yesterday without concern for  infection.   Problem List / ED Course / Critical interventions / Medication management  Patient presented for patient presents to ED concern for nausea, vomiting, abdominal pain.  Physical exam with epigastric tenderness to palpation.  Patient also tachycardic in the 1 8 teens on arrival.  Rest of physical exam reassuring.   I Ordered, and personally interpreted labs.  hCG negative.  CBC without leukocytosis or anemia.  CMP with mild high hypokalemia at 3.0.  Lipase within normal limits. I ordered imaging studies including CT Abd/Pelvis with contrast: evaluate for structural/surgical etiology of patients' severe abdominal pain.  I independently visualized and interpreted imaging and I agree with the radiologist interpretation of no acute process.. I personally viewed and interpreted the EKG/cardiac monitored which showed an underlying rhythm of: Sinus rhythm. Provided patient with IV fluids, pain control, Protonix , and nausea management.  Patient now feeling better.  Patient now tolerating PO.  HR downtrending to lower 100's and patient appears stable to continue hydrating at home. Patient ready go home.  Patient to follow-up with PCP. I have reviewed the patients home medicines and have made adjustments as needed The patient has been appropriately medically screened and/or stabilized in the ED. I have low suspicion for any other emergent medical condition which would require further screening, evaluation or treatment in the ED or require inpatient management. At time of discharge the patient is hemodynamically stable and in no acute distress. I have discussed work-up results and diagnosis with patient and answered all questions. Patient is agreeable with discharge plan. We discussed strict return precautions for returning to the emergency department and they verbalized understanding.    Social Determinants of Health:  none       Final diagnoses:  Nausea and vomiting, unspecified vomiting type     ED Discharge Orders          Ordered    ondansetron  (ZOFRAN -ODT) 4 MG disintegrating tablet  Every 8 hours PRN        10/26/23 1525                Hoy Nidia FALCON, [place] 10/26/23 1544    Bari Roxie HERO, DO 10/30/23 1555

## 2023-10-27 ENCOUNTER — Other Ambulatory Visit: Payer: Self-pay

## 2023-10-27 ENCOUNTER — Observation Stay (HOSPITAL_COMMUNITY)
Admission: EM | Admit: 2023-10-27 | Discharge: 2023-10-28 | Disposition: A | Discharge status: Home / Self Care | Attending: Internal Medicine | Admitting: Internal Medicine

## 2023-10-27 ENCOUNTER — Encounter (HOSPITAL_COMMUNITY): Payer: Self-pay | Admitting: Emergency Medicine

## 2023-10-27 DIAGNOSIS — F17210 Nicotine dependence, cigarettes, uncomplicated: Secondary | ICD-10-CM | POA: Diagnosis not present

## 2023-10-27 DIAGNOSIS — F12920 Cannabis use, unspecified with intoxication, uncomplicated: Secondary | ICD-10-CM | POA: Diagnosis not present

## 2023-10-27 DIAGNOSIS — F1290 Cannabis use, unspecified, uncomplicated: Secondary | ICD-10-CM | POA: Diagnosis present

## 2023-10-27 DIAGNOSIS — R112 Nausea with vomiting, unspecified: Principal | ICD-10-CM | POA: Insufficient documentation

## 2023-10-27 LAB — COMPREHENSIVE METABOLIC PANEL WITH GFR
ALT: 11 U/L (ref 0–44)
AST: 26 U/L (ref 15–41)
Albumin: 4.8 g/dL (ref 3.5–5.0)
Alkaline Phosphatase: 56 U/L (ref 38–126)
Anion gap: 17 — ABNORMAL HIGH (ref 5–15)
BUN: 11 mg/dL (ref 6–20)
CO2: 18 mmol/L — ABNORMAL LOW (ref 22–32)
Calcium: 9.9 mg/dL (ref 8.9–10.3)
Chloride: 106 mmol/L (ref 98–111)
Creatinine, Ser: 0.69 mg/dL (ref 0.44–1.00)
GFR, Estimated: >60 mL/min (ref >60)
Glucose, Bld: 118 mg/dL — ABNORMAL HIGH (ref 70–99)
Potassium: 3.3 mmol/L — ABNORMAL LOW (ref 3.5–5.1)
Sodium: 141 mmol/L (ref 135–145)
Total Bilirubin: 0.9 mg/dL (ref 0.0–1.2)
Total Protein: 8.6 g/dL — ABNORMAL HIGH (ref 6.5–8.1)

## 2023-10-27 LAB — URINALYSIS, ROUTINE W REFLEX MICROSCOPIC
Bacteria, UA: NONE SEEN
Bilirubin Urine: NEGATIVE
Glucose, UA: NEGATIVE mg/dL
Hgb urine dipstick: NEGATIVE
Ketones, ur: 80 mg/dL — AB
Leukocytes,Ua: NEGATIVE
Nitrite: NEGATIVE
Protein, ur: 100 mg/dL — AB
Specific Gravity, Urine: 1.027 (ref 1.005–1.030)
pH: 7 (ref 5.0–8.0)

## 2023-10-27 LAB — RAPID URINE DRUG SCREEN, HOSP PERFORMED
Amphetamines: NOT DETECTED
Barbiturates: NOT DETECTED
Benzodiazepines: NOT DETECTED
Cocaine: NOT DETECTED
Opiates: POSITIVE — AB
Tetrahydrocannabinol: POSITIVE — AB

## 2023-10-27 LAB — CBC
HCT: 40.2 % (ref 36.0–46.0)
Hemoglobin: 12.7 g/dL (ref 12.0–15.0)
MCH: 27.9 pg (ref 26.0–34.0)
MCHC: 31.6 g/dL (ref 30.0–36.0)
MCV: 88.4 fL (ref 80.0–100.0)
Platelets: 312 K/uL (ref 150–400)
RBC: 4.55 MIL/uL (ref 3.87–5.11)
RDW: 18.1 % — ABNORMAL HIGH (ref 11.5–15.5)
WBC: 8.4 K/uL (ref 4.0–10.5)
nRBC: 0 % (ref 0.0–0.2)

## 2023-10-27 LAB — HIV ANTIBODY (ROUTINE TESTING W REFLEX): HIV Screen 4th Generation wRfx: NONREACTIVE

## 2023-10-27 LAB — MAGNESIUM: Magnesium: 2.3 mg/dL (ref 1.7–2.4)

## 2023-10-27 LAB — HCG, SERUM, QUALITATIVE: Preg, Serum: NEGATIVE

## 2023-10-27 LAB — LIPASE, BLOOD: Lipase: 22 U/L (ref 11–51)

## 2023-10-27 MED ORDER — LORAZEPAM 2 MG/ML IJ SOLN
1.0000 mg | Freq: Once | INTRAMUSCULAR | Status: AC
  Administered 2023-10-27: 1 mg via INTRAVENOUS
  Filled 2023-10-27: qty 1

## 2023-10-27 MED ORDER — LABETALOL HCL 5 MG/ML IV SOLN: 5.0000 mg | INTRAVENOUS | Status: DC | PRN

## 2023-10-27 MED ORDER — METOCLOPRAMIDE HCL 5 MG/ML IJ SOLN
10.0000 mg | Freq: Four times a day (QID) | INTRAMUSCULAR | Status: DC | PRN
  Administered 2023-10-27 – 2023-10-28 (×3): 10 mg via INTRAVENOUS
  Filled 2023-10-27 (×3): qty 2

## 2023-10-27 MED ORDER — ONDANSETRON HCL 4 MG/2ML IJ SOLN
4.0000 mg | Freq: Four times a day (QID) | INTRAMUSCULAR | Status: DC | PRN
Start: 2023-10-27 — End: 2023-10-28
  Administered 2023-10-27 – 2023-10-28 (×4): 4 mg via INTRAVENOUS
  Filled 2023-10-27 (×4): qty 2

## 2023-10-27 MED ORDER — ALBUTEROL SULFATE (2.5 MG/3ML) 0.083% IN NEBU: 2.5000 mg | INHALATION_SOLUTION | RESPIRATORY_TRACT | Status: DC | PRN

## 2023-10-27 MED ORDER — POTASSIUM CHLORIDE 10 MEQ/100ML IV SOLN: 10.0000 meq | INTRAVENOUS | Status: DC

## 2023-10-27 MED ORDER — DEXTROSE 50 % IV SOLN: 0.0000 mL | INTRAVENOUS | Status: DC | PRN

## 2023-10-27 MED ORDER — LACTATED RINGERS IV SOLN: INTRAVENOUS | Status: DC

## 2023-10-27 MED ORDER — LACTATED RINGERS IV BOLUS
1000.0000 mL | Freq: Once | INTRAVENOUS | Status: AC
  Administered 2023-10-27: 1000 mL via INTRAVENOUS

## 2023-10-27 MED ORDER — ACETAMINOPHEN 325 MG PO TABS: 650.0000 mg | ORAL_TABLET | Freq: Four times a day (QID) | ORAL | Status: DC | PRN

## 2023-10-27 MED ORDER — HYDROMORPHONE HCL 1 MG/ML IJ SOLN
0.5000 mg | INTRAMUSCULAR | Status: DC | PRN
  Administered 2023-10-27 – 2023-10-28 (×7): 0.5 mg via INTRAVENOUS
  Filled 2023-10-27 (×7): qty 0.5

## 2023-10-27 MED ORDER — TRAZODONE HCL 50 MG PO TABS: 25.0000 mg | ORAL_TABLET | Freq: Every evening | ORAL | Status: DC | PRN

## 2023-10-27 MED ORDER — DROPERIDOL 2.5 MG/ML IJ SOLN
2.5000 mg | Freq: Once | INTRAMUSCULAR | Status: AC
  Administered 2023-10-27: 2.5 mg via INTRAVENOUS
  Filled 2023-10-27: qty 2

## 2023-10-27 MED ORDER — HYDRALAZINE HCL 20 MG/ML IJ SOLN
5.0000 mg | Freq: Four times a day (QID) | INTRAMUSCULAR | Status: DC | PRN
  Administered 2023-10-28: 5 mg via INTRAVENOUS
  Filled 2023-10-27: qty 1

## 2023-10-27 MED ORDER — ENOXAPARIN SODIUM 40 MG/0.4ML IJ SOSY
40.0000 mg | PREFILLED_SYRINGE | Freq: Every day | INTRAMUSCULAR | Status: DC
  Filled 2023-10-27: qty 0.4

## 2023-10-27 MED ORDER — ONDANSETRON HCL 4 MG PO TABS: 4.0000 mg | ORAL_TABLET | Freq: Four times a day (QID) | ORAL | Status: DC | PRN

## 2023-10-27 MED ORDER — LACTATED RINGERS IV BOLUS: 20.0000 mL/kg | Freq: Once | INTRAVENOUS | Status: DC

## 2023-10-27 MED ORDER — SODIUM CHLORIDE 0.9 % IV SOLN: INTRAVENOUS | Status: AC

## 2023-10-27 MED ORDER — DEXTROSE IN LACTATED RINGERS 5 % IV SOLN: INTRAVENOUS | Status: DC

## 2023-10-27 MED ORDER — INSULIN REGULAR(HUMAN) IN NACL 100-0.9 UT/100ML-% IV SOLN: INTRAVENOUS | Status: DC

## 2023-10-27 MED ORDER — ACETAMINOPHEN 650 MG RE SUPP: 650.0000 mg | Freq: Four times a day (QID) | RECTAL | Status: DC | PRN

## 2023-10-27 MED ORDER — LABETALOL HCL 5 MG/ML IV SOLN
5.0000 mg | INTRAVENOUS | Status: DC | PRN
  Administered 2023-10-27: 5 mg via INTRAVENOUS
  Filled 2023-10-27: qty 4

## 2023-10-27 NOTE — ED Notes (Addendum)
 Pt visibly swaying and slightly unstable on walk to bathroom. Assisted walk to bathroom and back. Pt back sleeping in bed with partner.

## 2023-10-27 NOTE — ED Notes (Signed)
 Called floor to check on room. Floor doesn't want to take pt with current BP. Repeating BP, notifying MD.

## 2023-10-27 NOTE — ED Notes (Signed)
 Attempted to get recollect for Lavender tube off IV in place. Was not able to get it to get good blood return for sample. IV fluids hooked back up and working for those.

## 2023-10-27 NOTE — ED Provider Notes (Signed)
 [place] EMERGENCY DEPARTMENT AT [facility] Provider Note   CSN: [id number] Arrival date & time: 10/27/23  9582     Patient presents with: Abdominal Pain and Emesis   Theresa Burnett is a 18 y.o. female who presents with concern for nausea vomiting and epigastric pain.  Patient has been seen twice in the last 36 hours for the same.  Was prescribed Zofran  and Phenergan  suppositories at home but states that she is only taken 2 doses of Zofran  since arriving home and that it is not helping.  Patient with history of cyclical vomiting syndrome, cannabis hyperemesis.  States she is not currently using cannabis.  Accompanied by friend at the bedside.  Patient received IV droperidol  the last 2 days with improvement in her symptoms.  Patient also has history of IDA and cholecystectomy in the past.  Labs have been reassuring both for preceding ED visits.  No diarrhea or urinary symptoms.   HPI     Prior to Admission medications   Medication Sig Start Date End Date Taking? Authorizing Provider  dicyclomine  (BENTYL ) 20 MG tablet Take 1 tablet (20 mg total) by mouth 2 (two) times daily. 07/14/23   Jarold Olam HERO, PA-C  ondansetron  (ZOFRAN ) 4 MG tablet Take 1 tablet (4 mg total) by mouth every 8 (eight) hours as needed for nausea or vomiting. 08/05/23   Nivia Colon, PA-C  ondansetron  (ZOFRAN -ODT) 4 MG disintegrating tablet Take 1 tablet (4 mg total) by mouth every 8 (eight) hours as needed for nausea or vomiting. 10/26/23   Hoy Nidia FALCON, PA-C  oxyCODONE -acetaminophen  (PERCOCET/ROXICET) 5-325 MG tablet Take 1 tablet by mouth every 6 (six) hours as needed for severe pain (pain score 7-10). 08/04/23   Tegeler, Lonni PARAS, MD  pantoprazole  (PROTONIX ) 40 MG tablet Take 1 tablet (40 mg total) by mouth daily for 14 days. 09/05/23 09/19/23  Dreama Longs, MD  potassium chloride  SA (KLOR-CON  M) 20 MEQ tablet Take 1 tablet (20 mEq total) by mouth daily. 10/26/23   Hoy Nidia FALCON, PA-C   promethazine  (PHENERGAN ) 25 MG suppository Place 1 suppository (25 mg total) rectally every 6 (six) hours as needed for nausea or vomiting. 10/25/23   Laurice Maude BROCKS, MD  promethazine  (PHENERGAN ) 25 MG tablet Take 1 tablet (25 mg total) by mouth every 8 (eight) hours as needed for nausea or vomiting. 08/04/23   Tegeler, Lonni PARAS, MD  sucralfate  (CARAFATE ) 1 g tablet Take 1 tablet (1 g total) by mouth 4 (four) times daily -  with meals and at bedtime. 06/22/23   Haze Lonni PARAS, MD    Allergies: Patient has no known allergies.    Review of Systems  Gastrointestinal:  Positive for abdominal pain, nausea and vomiting.    Updated Vital Signs BP (!) 169/119   Pulse (!) 108   Temp 99.1 F (37.3 C) (Oral)   Resp 20   Wt 46.7 kg   LMP 10/22/2023 (Approximate)   SpO2 100%   BMI 18.24 kg/m   Physical Exam Vitals and nursing note reviewed.  Constitutional:      Appearance: Theresa Burnett is ill-appearing. Theresa Burnett is not toxic-appearing.  HENT:     Head: Normocephalic and atraumatic.     Mouth/Throat:     Mouth: Mucous membranes are moist.     Pharynx: No oropharyngeal exudate or posterior oropharyngeal erythema.  Eyes:     General:        Right eye: No discharge.        Left  eye: No discharge.     Conjunctiva/sclera: Conjunctivae normal.  Cardiovascular:     Rate and Rhythm: Normal rate and regular rhythm.     Pulses: Normal pulses.     Heart sounds: Normal heart sounds. No murmur heard. Pulmonary:     Effort: Pulmonary effort is normal. No respiratory distress.     Breath sounds: Normal breath sounds. No wheezing or rales.  Abdominal:     General: Bowel sounds are normal. There is no distension.     Palpations: Abdomen is soft.     Tenderness: There is abdominal tenderness in the epigastric area.  Musculoskeletal:        General: No deformity.     Cervical back: Neck supple.  Skin:    General: Skin is warm and dry.  Neurological:     Mental Status: Theresa Burnett is alert.  Mental status is at baseline.  Psychiatric:        Mood and Affect: Mood normal.     (all labs ordered are listed, but only abnormal results are displayed) Labs Reviewed  COMPREHENSIVE METABOLIC PANEL WITH GFR - Abnormal; Notable for the following components:      Result Value   Potassium 3.3 (*)    CO2 18 (*)    Glucose, Bld 118 (*)    Total Protein 8.6 (*)    Anion gap 17 (*)    All other components within normal limits  CBC - Abnormal; Notable for the following components:   RDW 18.1 (*)    All other components within normal limits  LIPASE, BLOOD  HCG, SERUM, QUALITATIVE  URINALYSIS, ROUTINE W REFLEX MICROSCOPIC  RAPID URINE DRUG SCREEN, HOSP PERFORMED    EKG: None  Radiology: CT ABDOMEN PELVIS W CONTRAST Result Date: 10/26/2023 CLINICAL DATA:  Abdominal pain, acute nonlocalized EXAM: CT ABDOMEN AND PELVIS WITH CONTRAST TECHNIQUE: Multidetector CT imaging of the abdomen and pelvis was performed using the standard protocol following bolus administration of intravenous contrast. RADIATION DOSE REDUCTION: This exam was performed according to the departmental dose-optimization program which includes automated exposure control, adjustment of the mA and/or kV according to patient size and/or use of iterative reconstruction technique. CONTRAST:  100mL OMNIPAQUE  IOHEXOL  300 MG/ML  SOLN COMPARISON:  None Available. FINDINGS: Lower chest: Lung bases are clear. Hepatobiliary: No focal hepatic lesion. Postcholecystectomy. No biliary duct dilatation. Common bile duct is normal. Pancreas: Pancreas is normal. No ductal dilatation. No pancreatic inflammation. Spleen: Normal spleen Adrenals/urinary tract: Adrenal glands and kidneys are normal. The ureters and bladder normal. Stomach/Bowel: Stomach, small bowel, appendix, and cecum are normal. Appendix identified on image 54/2. The colon and rectosigmoid colon are normal. Vascular/Lymphatic: Abdominal aorta is normal caliber. No periportal or  retroperitoneal adenopathy. No pelvic adenopathy. Reproductive: Uterus and adnexa unremarkable. Moderate volume of free fluid in the posterior cul-de-sac. Fluid organized in simple Other: No organized fluid collections.  No inflammation Musculoskeletal: No aggressive osseous lesion. IMPRESSION: 1. No acute findings in the abdomen pelvis. 2. Normal appendix. 3. Moderate volume of free fluid in the posterior cul-de-sac is likely physiologic. Electronically Signed   By: Jackquline Boxer M.D.   On: 10/26/2023 12:26     Procedures   Medications Ordered in the ED  lactated ringers  bolus 1,000 mL (1,000 mLs Intravenous New Bag/Given 10/27/23 0518)  droperidol  (INAPSINE ) 2.5 MG/ML injection 2.5 mg (2.5 mg Intravenous Given 10/27/23 0518)  LORazepam  (ATIVAN ) injection 1 mg (1 mg Intravenous Given 10/27/23 0614)  Medical Decision Making 18 year old female who presents for her third visit in 3 days with report of intractable nausea and vomiting as well as epigastric pain.  Notably intake, vital signs otherwise reassuring.  Patient subsequently became very tearful and tachycardic at that time.  Cardiopulmonary abdominal exams are reassuring.  No CVAT.  DDx includes not limited to acute gastroenteritis, foodborne illness, cyclical vomiting syndrome, bowel obstruction.    Amount and/or Complexity of Data Reviewed Labs: ordered.    Details: CBC unremarkable, CMP with hypokalemia of 3.3, mildly elevated anion gap to 17.  Lipase is 22 pregnancy test is negative.  UA and UDS pending at time of shift change.   ECG/medicine tests:     Details: EKG with borderline prolonged QT 494.    Risk Prescription drug management.   Patient reports persistent vomiting after droperidol , requesting more meds. Additional medications offered. Care of this patient signed out to oncoming ED provider CANDIE Mays, PA-C at time of shift change. All pertinent HPI, physical exam, and laboratory  findings were discussed with them prior to my departure. Disposition of patient pending completion of workup, reevaluation, and clinical judgement of oncoming ED provider.  Patient pending reevaluation after second antiemetic. Would have low threshold to admit for intractable nausea and vomiting.   This chart was dictated using voice recognition software, Dragon. Despite the best efforts of this provider to proofread and correct errors, errors may still occur which can change documentation meaning.       Final diagnoses:  None    ED Discharge Orders     None          Bobette Pleasant JONELLE DEVONNA 10/27/23 9356    Theadore Ozell HERO, MD 10/27/23 [phone]

## 2023-10-27 NOTE — ED Provider Notes (Signed)
  Accepted handoff at shift change from Sponseller PA-C. Please see prior provider note for more detail.   Briefly: Patient is 18 y.o.  concern for nausea vomiting and epigastric pain.  Patient has been seen twice in the last 36 hours for the same.  Was prescribed Zofran  and Phenergan  suppositories at home but states that she is only taken 2 doses of Zofran  since arriving home and that it is not helping.  Patient with history of cyclical vomiting syndrome, cannabis hyperemesis.  States she is not currently using cannabis.  Accompanied by friend at the bedside.  Patient received IV droperidol  the last 2 days with improvement in her symptoms.  Patient also has history of IDA and cholecystectomy in the past.  Labs have been reassuring both for preceding ED visits.  No diarrhea or urinary symptoms.  Plan:  - dispo pending lab workup and PO challenge - hCG negative.  UA not concerning for infection.  CBC without leukocytosis or anemia.  CMP with mild hypokalemia at 3.3.  CO2 is also mildly low at 18.  Anion gap is 17.  Magnesium  2.3.  Lipase within normal limits.  UDS positive for opiates and cannabis. - I suspicion the patient's symptoms are d/t cannabis hyperemesis.  Patient still feeling incredibly nauseous after Ativan , droperidol , and 1L IV fluids.  This is patient's third time being here in the past 48 hours.  Shared decision making with patient who would like to be admitted today for further symptom management. - Consulted with hospitalist on-call Dr. Roxane who agrees to admit patient.   .Critical Care  Performed by: Hoy Nidia FALCON, PA-C Authorized by: Hoy Nidia FALCON, PA-C   Critical care provider statement:    Critical care time (minutes):  30   Critical care was necessary to treat or prevent imminent or life-threatening deterioration of the following conditions: intractable nausea and vomiting.   Critical care was time spent personally by me on the following activities:   Development of treatment plan with patient or surrogate, discussions with consultants, evaluation of patient's response to treatment, examination of patient, ordering and review of laboratory studies, ordering and review of radiographic studies, ordering and performing treatments and interventions, pulse oximetry, re-evaluation of patient's condition and review of old charts   Care discussed with: admitting provider           Hoy Nidia FALCON, PA-C 10/27/23 9161    Theadore Ozell HERO, MD 10/28/23 0700

## 2023-10-27 NOTE — H&P (Signed)
 History and Physical  Theresa Burnett FMW:[id number] DOB: Dec 13, 2005 DOA: 10/27/2023  PCP: Howell Lunger, DO   Chief Complaint: Nausea and vomiting  HPI: Theresa Burnett is a 18 y.o. female with medical history significant for dysautonomia, cannabinoid hyperemesis syndrome being admitted to the hospital with 3 days of intractable nausea and vomiting.  Says this is typical of her prior episodes related to cannabinoid hyperemesis, though she denies current marijuana use.  Urine drug screen is positive for opiates and THC.  She denies any fevers, chest pain, hematemesis, blood in her stool or any other complaints.  Has some mild abdominal pain but says this is cramping from vomiting.  This is her third ER visit in the past 48 hours, overall she does not feel improved, failed p.o. challenge this morning again so hospitalist admission was requested.  Says that when this happens, hot showers seem to help.  Review of Systems: Please see HPI for pertinent positives and negatives. A complete 10 system review of systems are otherwise negative.  Past Medical History:  Diagnosis Date   H. pylori infection    Past Surgical History:  Procedure Laterality Date   CHOLECYSTECTOMY     Social History:  reports that Aparna has quit smoking. Gerardo's smoking use included cigarettes and e-cigarettes. Berneice has been exposed to tobacco smoke. Kenyona has never used smokeless tobacco. Benigna denies marijuana use.  Latisa reports that Raseel does not drink alcohol.  No Known Allergies  Family History  Problem Relation Age of Onset   Kidney disease Maternal Grandmother    COPD Maternal Grandmother    Hypertension Maternal Grandmother    Diabetes Maternal Grandmother      Prior to Admission medications   Medication Sig Start Date End Date Taking? Authorizing Provider  amitriptyline  (ELAVIL ) 25 MG tablet Take 25 mg by mouth. 10/17/23  Yes [provider]  potassium chloride  (KLOR-CON ) 20  MEQ packet Take 20 mEq by mouth as directed. 10/29/22  Yes [provider]  dicyclomine  (BENTYL ) 20 MG tablet Take 1 tablet (20 mg total) by mouth 2 (two) times daily. 07/14/23   Jarold Olam HERO, PA-C  ondansetron  (ZOFRAN ) 4 MG tablet Take 1 tablet (4 mg total) by mouth every 8 (eight) hours as needed for nausea or vomiting. 08/05/23   Nivia Colon, PA-C  ondansetron  (ZOFRAN -ODT) 4 MG disintegrating tablet Take 1 tablet (4 mg total) by mouth every 8 (eight) hours as needed for nausea or vomiting. 10/26/23   Hoy Nidia FALCON, PA-C  oxyCODONE -acetaminophen  (PERCOCET/ROXICET) 5-325 MG tablet Take 1 tablet by mouth every 6 (six) hours as needed for severe pain (pain score 7-10). 08/04/23   Tegeler, Lonni PARAS, MD  pantoprazole  (PROTONIX ) 40 MG tablet Take 1 tablet (40 mg total) by mouth daily for 14 days. 09/05/23 09/19/23  Dreama Longs, MD  potassium chloride  SA (KLOR-CON  M) 20 MEQ tablet Take 1 tablet (20 mEq total) by mouth daily. 10/26/23   Hoy Nidia FALCON, PA-C  promethazine  (PHENERGAN ) 25 MG suppository Place 1 suppository (25 mg total) rectally every 6 (six) hours as needed for nausea or vomiting. 10/25/23   Laurice Maude BROCKS, MD  promethazine  (PHENERGAN ) 25 MG tablet Take 1 tablet (25 mg total) by mouth every 8 (eight) hours as needed for nausea or vomiting. 08/04/23   Tegeler, Lonni PARAS, MD  sucralfate  (CARAFATE ) 1 g tablet Take 1 tablet (1 g total) by mouth 4 (four) times daily -  with meals and at bedtime. 06/22/23   Haze Lonni PARAS,  MD    Physical Exam: BP (!) 170/106   Pulse 100   Temp 98.6 F (37 C) (Oral)   Resp 13   Wt 46.7 kg   LMP 10/22/2023 (Approximate)   SpO2 99%   BMI 18.24 kg/m  General:  Alert, oriented, calm, in no acute distress  Eyes: EOMI, clear conjuctivae, white sclerea Neck: supple, no masses, trachea mildline  Cardiovascular: RRR, no murmurs or rubs, no peripheral edema  Respiratory: clear to auscultation bilaterally, no wheezes, no crackles   Abdomen: soft, nontender, nondistended, normal bowel tones heard  Skin: dry, no rashes  Musculoskeletal: no joint effusions, normal range of motion  Psychiatric: appropriate affect, normal speech  Neurologic: extraocular muscles intact, clear speech, moving all extremities with intact sensorium         Labs on Admission:  Basic Metabolic Panel: Recent Labs  Lab 10/25/23 1735 10/26/23 1102 10/27/23 0436  NA 143 141 141  K 3.2* 3.0* 3.3*  CL 106 105 106  CO2 23 22 18*  GLUCOSE 121* 124* 118*  BUN 5* 9 11  CREATININE 0.59 0.59 0.69  CALCIUM  9.9 9.8 9.9  MG  --   --  2.3   Liver Function Tests: Recent Labs  Lab 10/25/23 1735 10/26/23 1102 10/27/23 0436  AST 24 22 26   ALT 9 11 11   ALKPHOS 61 63 56  BILITOT 0.7 1.0 0.9  PROT 8.3* 8.6* 8.6*  ALBUMIN 4.5 4.7 4.8   Recent Labs  Lab 10/25/23 1735 10/26/23 1102 10/27/23 0436  LIPASE 23 22 22    No results for input(s): AMMONIA in the last 168 hours. CBC: Recent Labs  Lab 10/25/23 1735 10/26/23 1102 10/27/23 0611  WBC 8.1 9.0 8.4  NEUTROABS  --  7.0  --   HGB 13.2 12.6 12.7  HCT 42.2 39.2 40.2  MCV 89.4 87.3 88.4  PLT 331 349 312   Cardiac Enzymes: No results for input(s): CKTOTAL, CKMB, CKMBINDEX, TROPONINI in the last 168 hours. BNP (last 3 results) No results for input(s): BNP in the last 8760 hours.  ProBNP (last 3 results) No results for input(s): PROBNP in the last 8760 hours.  CBG: No results for input(s): GLUCAP in the last 168 hours.  Radiological Exams on Admission: CT ABDOMEN PELVIS W CONTRAST Result Date: 10/26/2023 CLINICAL DATA:  Abdominal pain, acute nonlocalized EXAM: CT ABDOMEN AND PELVIS WITH CONTRAST TECHNIQUE: Multidetector CT imaging of the abdomen and pelvis was performed using the standard protocol following bolus administration of intravenous contrast. RADIATION DOSE REDUCTION: This exam was performed according to the departmental dose-optimization program which  includes automated exposure control, adjustment of the mA and/or kV according to patient size and/or use of iterative reconstruction technique. CONTRAST:  OMNIPAQUE  IOHEXOL  300 MG/ML  SOLN COMPARISON:  None Available. FINDINGS: Lower chest: Lung bases are clear. Hepatobiliary: No focal hepatic lesion. Postcholecystectomy. No biliary duct dilatation. Common bile duct is normal. Pancreas: Pancreas is normal. No ductal dilatation. No pancreatic inflammation. Spleen: Normal spleen Adrenals/urinary tract: Adrenal glands and kidneys are normal. The ureters and bladder normal. Stomach/Bowel: Stomach, small bowel, appendix, and cecum are normal. Appendix identified on image 54/2. The colon and rectosigmoid colon are normal. Vascular/Lymphatic: Abdominal aorta is normal caliber. No periportal or retroperitoneal adenopathy. No pelvic adenopathy. Reproductive: Uterus and adnexa unremarkable. Moderate volume of free fluid in the posterior cul-de-sac. Fluid organized in simple Other: No organized fluid collections.  No inflammation Musculoskeletal: No aggressive osseous lesion. IMPRESSION: 1. No acute findings in the abdomen pelvis.  2. Normal appendix. 3. Moderate volume of free fluid in the posterior cul-de-sac is likely physiologic. Electronically Signed   By: Jackquline Boxer M.D.   On: 10/26/2023 12:26   Assessment/Plan Hatsuko E Hudman is a 18 y.o. female with medical history significant for dysautonomia, cannabinoid hyperemesis syndrome being admitted to the hospital with 3 days of intractable nausea and vomiting likely due to cannabinoid hyperemesis. -Observation admission -IV fluids -IV Zofran  for nausea/vomiting, IV Reglan  for refractory nausea/vomiting -Oral diet as tolerated  Other chronic home medications will be continued once reconciled  DVT prophylaxis: Lovenox      Code Status: Full Code  Consults called: None  Admission status: Observation  Time spent: 48 minutes  Renner Sebald CHRISTELLA Gail  MD [facility] Pager [phone]  If 7PM-7AM, please contact night-coverage [email or web address] Password [facility]  10/27/2023, 8:42 AM

## 2023-10-27 NOTE — ED Triage Notes (Addendum)
 Pt in with reported abdominal pain and emesis, seen the past 2 days for same. States Zofran  not helping, actively vomiting during triage

## 2023-10-28 DIAGNOSIS — R112 Nausea with vomiting, unspecified: Secondary | ICD-10-CM | POA: Diagnosis not present

## 2023-10-28 DIAGNOSIS — F1290 Cannabis use, unspecified, uncomplicated: Secondary | ICD-10-CM | POA: Diagnosis not present

## 2023-10-28 LAB — BASIC METABOLIC PANEL WITH GFR
Anion gap: 13 (ref 5–15)
BUN: 10 mg/dL (ref 6–20)
CO2: 25 mmol/L (ref 22–32)
Calcium: 9.2 mg/dL (ref 8.9–10.3)
Chloride: 98 mmol/L (ref 98–111)
Creatinine, Ser: 0.58 mg/dL (ref 0.44–1.00)
GFR, Estimated: >60 mL/min (ref >60)
Glucose, Bld: 116 mg/dL — ABNORMAL HIGH (ref 70–99)
Potassium: 3.1 mmol/L — ABNORMAL LOW (ref 3.5–5.1)
Sodium: 136 mmol/L (ref 135–145)

## 2023-10-28 LAB — CBC
HCT: 39.7 % (ref 36.0–46.0)
Hemoglobin: 12.7 g/dL (ref 12.0–15.0)
MCH: 28 pg (ref 26.0–34.0)
MCHC: 32 g/dL (ref 30.0–36.0)
MCV: 87.6 fL (ref 80.0–100.0)
Platelets: 303 K/uL (ref 150–400)
RBC: 4.53 MIL/uL (ref 3.87–5.11)
RDW: 17.7 % — ABNORMAL HIGH (ref 11.5–15.5)
WBC: 8.1 K/uL (ref 4.0–10.5)
nRBC: 0 % (ref 0.0–0.2)

## 2023-10-28 MED ORDER — KETOROLAC TROMETHAMINE 30 MG/ML IJ SOLN: 30.0000 mg | Freq: Four times a day (QID) | INTRAMUSCULAR | Status: DC | PRN

## 2023-10-28 MED ORDER — ONDANSETRON HCL 4 MG PO TABS: 4.0000 mg | ORAL_TABLET | Freq: Four times a day (QID) | ORAL | 0 refills | Status: DC | PRN

## 2023-10-28 MED ORDER — NICOTINE 14 MG/24HR TD PT24: 14.0000 mg | MEDICATED_PATCH | Freq: Every day | TRANSDERMAL | 0 refills | Status: DC

## 2023-10-28 MED ORDER — SUCRALFATE 1 G PO TABS: 1.0000 g | ORAL_TABLET | Freq: Three times a day (TID) | ORAL | 0 refills | Status: DC

## 2023-10-28 MED ORDER — POTASSIUM CHLORIDE CRYS ER 20 MEQ PO TBCR: 40.0000 meq | EXTENDED_RELEASE_TABLET | Freq: Once | ORAL | Status: DC

## 2023-10-28 MED ORDER — CAPSAICIN 0.075 % EX CREA
1.0000 | TOPICAL_CREAM | Freq: Two times a day (BID) | CUTANEOUS | 0 refills | Status: DC
Start: 2023-10-28 — End: 2024-01-07

## 2023-10-28 MED ORDER — PANTOPRAZOLE SODIUM 40 MG PO TBEC: 40.0000 mg | DELAYED_RELEASE_TABLET | Freq: Every day | ORAL | 0 refills | Status: DC

## 2023-10-28 MED ORDER — NICOTINE 14 MG/24HR TD PT24
14.0000 mg | MEDICATED_PATCH | Freq: Every day | TRANSDERMAL | Status: DC
  Administered 2023-10-28: 14 mg via TRANSDERMAL
  Filled 2023-10-28: qty 1

## 2023-10-28 MED ORDER — POTASSIUM CHLORIDE 10 MEQ/100ML IV SOLN
10.0000 meq | INTRAVENOUS | Status: DC
  Administered 2023-10-28 (×3): 10 meq via INTRAVENOUS
  Filled 2023-10-28 (×3): qty 100

## 2023-10-28 NOTE — Hospital Course (Signed)
 17yo with h/o dysautonomia and cannabinoid hyperemesis syndrome who presented on 7/5 with intractable n/v in the setting of ongoing THC use (patient denies but UDS + for opiates and THC).  Monitored overnight, feeling well enough to want to go outside to smoke today.  Will discharge.

## 2023-10-28 NOTE — Discharge Summary (Signed)
 Physician Discharge Summary   Patient: Theresa Burnett MRN: [id number] DOB: December 31, 2005  Admit date:     10/27/2023  Discharge date: 10/28/23  Discharge Physician: Delon Herald   PCP: Howell Lunger, DO   Recommendations at discharge:   Stop using marijuana, as this is very likely contributing to your issues with nausea and vomiting Stop smoking; nicotine  patch provided Drink plenty of (non-alcoholic) liquids Avoid opiates/narcotic pain medications, as this slows gastric motility and can make your issues worse Take Protonix  daily and Carafate  4 times a day (with meals and at bedtime) Zofran  ordered for as needed use Zostrix cream ordered - apply a thin later around your belly button once a day Follow up with Dr. Howell in 1-2 weeks  Discharge Diagnoses: Principal Problem:   Cannabinoid hyperemesis syndrome    Hospital Course: 17yo with h/o dysautonomia and cannabinoid hyperemesis syndrome who presented on 7/5 with intractable n/v in the setting of ongoing THC use (patient denies but UDS + for opiates and THC).  Monitored overnight, feeling well enough to want to go outside to smoke today.  Will discharge.  Assessment and Plan:  Intractable nausea and vomiting likely due to cannabinoid hyperemesis Placed in observation overnight Started off this AM requesting to leave the floor for fresh air after reportedly smoking in her room overnight Emesis this AM so diet was changed to NPO and she was unhappy with this and now wants to discharge Encouraged to continue to push PO fluids at home Strongly encouraged to stop THC Resume Protonix  daily, Carafate  QID Zofran  and Zostrix also ordered Will dc to home  Tobacco use d/o Cessation encouraged Nicotine  patch ordered  Marijuana use Cessation encouraged UDS also + opiates, needs to avoid     Pain control -   Controlled Substance Reporting System database was reviewed. and patient was instructed, not to drive,  operate heavy machinery, perform activities at heights, swimming or participation in water activities or provide baby-sitting services while on Pain, Sleep and Anxiety Medications; until their outpatient Physician has advised to do so again. Also recommended to not to take more than prescribed Pain, Sleep and Anxiety Medications.    Disposition: Home Diet recommendation:  Full liquid diet DISCHARGE MEDICATION: Allergies as of 10/28/2023   No Known Allergies      Medication List     STOP taking these medications    dicyclomine  20 MG tablet Commonly known as: BENTYL    ondansetron  4 MG disintegrating tablet Commonly known as: ZOFRAN -ODT   oxyCODONE -acetaminophen  5-325 MG tablet Commonly known as: PERCOCET/ROXICET   potassium chloride  SA 20 MEQ tablet Commonly known as: KLOR-CON  M   promethazine  25 MG suppository Commonly known as: PHENERGAN    promethazine  25 MG tablet Commonly known as: PHENERGAN        TAKE these medications    capsicum 0.075 % topical cream Commonly known as: ZOSTRIX Apply 1 Application topically 2 (two) times daily.   nicotine  14 mg/24hr patch Commonly known as: NICODERM CQ  - dosed in mg/24 hours Place 1 patch (14 mg total) onto the skin daily. Start taking on: October 29, 2023   ondansetron  4 MG tablet Commonly known as: ZOFRAN  Take 1 tablet (4 mg total) by mouth every 6 (six) hours as needed for nausea. What changed:  when to take this reasons to take this   pantoprazole  40 MG tablet Commonly known as: Protonix  Take 1 tablet (40 mg total) by mouth daily.   sucralfate  1 g tablet Commonly known as: Carafate  Take 1 tablet (  1 g total) by mouth 4 (four) times daily -  with meals and at bedtime.        Discharge Exam:   Subjective: Emesis this AM but really upset with not being able to eat/drink.  Wants to go home.   Objective: Vitals:   10/28/23 0646 10/28/23 1206  BP: 110/60 119/73  Pulse: (!) 101 91  Resp:  18  Temp:  98 F  (36.7 C)  SpO2:  100%    Intake/Output Summary (Last 24 hours) at 10/28/2023 1231 Last data filed at 10/28/2023 0900 Gross per 24 hour  Intake 1834.15 ml  Output 400 ml  Net 1434.15 ml   Filed Weights   10/27/23 0423  Weight: 46.7 kg    Exam:  General:  Appears calm and comfortable and is in NAD Eyes:  normal lids, iris ENT:  grossly normal hearing, lips & tongue, mmm Cardiovascular:  RRR. No LE edema.  Respiratory:   CTA bilaterally with no wheezes/rales/rhonchi.  Normal respiratory effort. Abdomen:  soft, NT, ND Skin:  no rash or induration seen on limited exam Musculoskeletal:  grossly normal tone BUE/BLE, good ROM, no bony abnormality Psychiatric:  blunted mood and affect, speech fluent and appropriate, AOx3 Neurologic:  CN 2-12 grossly intact, moves all extremities in coordinated fashion  Data Reviewed: I have reviewed the patient's lab results since admission.  Pertinent labs for today include:   K+ 3.1 Glucose 116 Normal CBC    Condition at discharge: fair  The results of significant diagnostics from this hospitalization (including imaging, microbiology, ancillary and laboratory) are listed below for reference.   Imaging Studies: CT ABDOMEN PELVIS W CONTRAST Result Date: 10/26/2023 CLINICAL DATA:  Abdominal pain, acute nonlocalized EXAM: CT ABDOMEN AND PELVIS WITH CONTRAST TECHNIQUE: Multidetector CT imaging of the abdomen and pelvis was performed using the standard protocol following bolus administration of intravenous contrast. RADIATION DOSE REDUCTION: This exam was performed according to the departmental dose-optimization program which includes automated exposure control, adjustment of the mA and/or kV according to patient size and/or use of iterative reconstruction technique. CONTRAST:  OMNIPAQUE  IOHEXOL  300 MG/ML  SOLN COMPARISON:  None Available. FINDINGS: Lower chest: Lung bases are clear. Hepatobiliary: No focal hepatic lesion. Postcholecystectomy. No  biliary duct dilatation. Common bile duct is normal. Pancreas: Pancreas is normal. No ductal dilatation. No pancreatic inflammation. Spleen: Normal spleen Adrenals/urinary tract: Adrenal glands and kidneys are normal. The ureters and bladder normal. Stomach/Bowel: Stomach, small bowel, appendix, and cecum are normal. Appendix identified on image 54/2. The colon and rectosigmoid colon are normal. Vascular/Lymphatic: Abdominal aorta is normal caliber. No periportal or retroperitoneal adenopathy. No pelvic adenopathy. Reproductive: Uterus and adnexa unremarkable. Moderate volume of free fluid in the posterior cul-de-sac. Fluid organized in simple Other: No organized fluid collections.  No inflammation Musculoskeletal: No aggressive osseous lesion. IMPRESSION: 1. No acute findings in the abdomen pelvis. 2. Normal appendix. 3. Moderate volume of free fluid in the posterior cul-de-sac is likely physiologic. Electronically Signed   By: Jackquline Boxer M.D.   On: 10/26/2023 12:26    Microbiology: Results for orders placed or performed during the hospital encounter of 01/07/22  SARS Coronavirus 2 by RT PCR (hospital order, performed in [facility] hospital lab) *cepheid single result test* Anterior Nasal Swab     Status: None   Collection Time: 01/07/22  6:42 PM   Specimen: Anterior Nasal Swab  Result Value Ref Range Status   SARS Coronavirus 2 by RT PCR NEGATIVE NEGATIVE Final  Comment: (NOTE) SARS-CoV-2 target nucleic acids are NOT DETECTED.  The SARS-CoV-2 RNA is generally detectable in upper and lower respiratory specimens during the acute phase of infection. The lowest concentration of SARS-CoV-2 viral copies this assay can detect is 250 copies / mL. A negative result does not preclude SARS-CoV-2 infection and should not be used as the sole basis for treatment or other patient management decisions.  A negative result may occur with improper specimen collection / handling, submission of specimen  other than nasopharyngeal swab, presence of viral mutation(s) within the areas targeted by this assay, and inadequate number of viral copies (<250 copies / mL). A negative result must be combined with clinical observations, patient history, and epidemiological information.  Fact Sheet for Patients:   RoadLapTop.co.za  Fact Sheet for Healthcare Providers: http://kim-miller.com/  This test is not yet approved or  cleared by the [place]  FDA and has been authorized for detection and/or diagnosis of SARS-CoV-2 by FDA under an Emergency Use Authorization (EUA).  This EUA will remain in effect (meaning this test can be used) for the duration of the COVID-19 declaration under Section 564(b)(1) of the Act, 21 U.S.C. section 360bbb-3(b)(1), unless the authorization is terminated or revoked sooner.  Performed at [facility], [address] [place], [place] [address]     Labs: CBC: Recent Labs  Lab 10/25/23 1735 10/26/23 1102 10/27/23 0611 10/28/23 0402  WBC 8.1 9.0 8.4 8.1  NEUTROABS  --  7.0  --   --   HGB 13.2 12.6 12.7 12.7  HCT 42.2 39.2 40.2 39.7  MCV 89.4 87.3 88.4 87.6  PLT 331 349 312 303   Basic Metabolic Panel: Recent Labs  Lab 10/25/23 1735 10/26/23 1102 10/27/23 0436 10/28/23 0402  NA 143 141 141 136  K 3.2* 3.0* 3.3* 3.1*  CL 106 105 106 98  CO2 23 22 18* 25  GLUCOSE 121* 124* 118* 116*  BUN 5* 9 11 10   CREATININE 0.59 0.59 0.69 0.58  CALCIUM  9.9 9.8 9.9 9.2  MG  --   --  2.3  --    Liver Function Tests: Recent Labs  Lab 10/25/23 1735 10/26/23 1102 10/27/23 0436  AST 24 22 26   ALT 9 11 11   ALKPHOS 61 63 56  BILITOT 0.7 1.0 0.9  PROT 8.3* 8.6* 8.6*  ALBUMIN 4.5 4.7 4.8   CBG: No results for input(s): GLUCAP in the last 168 hours.  Discharge time spent: greater than 30 minutes.  Signed: Delon Herald, MD [facility] 10/28/2023

## 2023-10-28 NOTE — Progress Notes (Signed)
 Writer entered pt room to administer requested PRN meds and again room had strong smell of cigarette smoke. Told the ladies that we have already talked about smoking in the hospital and that it would not be tolerated. Pt adamant that they had not been smoking in the room. Informed them that this would be the last warning and that [facility] and security would be involved if it happened again.

## 2023-10-28 NOTE — Progress Notes (Signed)
 Pt came to nurses station c/o IV site pain. Followed to pt room to assess and when entering pt room writer noticed very strong cigarette smell. Writer warned and educated pt and guest about the use of tobacco products in hospital. Pt stated ok.

## 2023-10-28 NOTE — Plan of Care (Signed)
  Problem: Clinical Measurements: Goal: Ability to maintain clinical measurements within normal limits will improve Outcome: Progressing Goal: Diagnostic test results will improve Outcome: Progressing   Problem: Pain Managment: Goal: General experience of comfort will improve and/or be controlled Outcome: Progressing

## 2023-10-28 NOTE — TOC Initial Note (Signed)
 Transition of Care [facility]) - Initial/Assessment Note    Patient Details  Name: Theresa Burnett MRN: [id number] Date of Birth: 2005/09/26  [facility] CM/SW Contact:    Sonda Manuella Quill, RN Phone Number: 10/28/2023, 11:29 AM  Clinical Narrative:                 Spoke w/ pt in room; pt says she lives at home w/ her parents; she identified POC mother Thor Poet ([phone]); pt says she will arrange transportation; she verified insurance/PCP; pt says she has difficulty paying for housing and utilities; she denied other SDOH risks; pt says she does not have DME, HH services, or home oxygen; she agreed to receive resources for social services, and financial assistance; resources placed in d/c instructions; copies of resources also given to pt; she will make her appt at agencies of choice; TOC is signing off; please place consult if needed.  Expected Discharge Plan: Home/Self Care Barriers to Discharge: Continued Medical Work up   Patient Goals and CMS Choice Patient states their goals for this hospitalization and ongoing recovery are:: home          Expected Discharge Plan and Services   Discharge Planning Services: CM Consult   Living arrangements for the past 2 months: Single Family Home                                      Prior Living Arrangements/Services Living arrangements for the past 2 months: Single Family Home Lives with:: Parents Patient language and need for interpreter reviewed:: Yes Do you feel safe going back to the place where you live?: Yes      Need for Family Participation in Patient Care: Yes (Comment) Care giver support system in place?: Yes (comment) Current home services:  (n/a) Criminal Activity/Legal Involvement Pertinent to Current Situation/Hospitalization: No - Comment as needed  Activities of Daily Living   ADL Screening (condition at time of admission) Independently performs ADLs?: Yes (appropriate for  developmental age) Is the patient deaf or have difficulty hearing?: No Does the patient have difficulty seeing, even when wearing glasses/contacts?: No Does the patient have difficulty concentrating, remembering, or making decisions?: No  Permission Sought/Granted Permission sought to share information with : Case Manager Permission granted to share information with : Yes, Verbal Permission Granted  Share Information with NAME: Case Manager     Permission granted to share info w Relationship: Thor Poet (mother) [phone]     Emotional Assessment Appearance:: Appears stated age Attitude/Demeanor/Rapport: Gracious Affect (typically observed): Accepting Orientation: : Oriented to Self, Oriented to Place, Oriented to  Time, Oriented to Situation Alcohol / Substance Use: Not Applicable Psych Involvement: No (comment)  Admission diagnosis:  Cannabinoid hyperemesis syndrome [R11.2, F12.90] Patient Active Problem List   Diagnosis Date Noted   Cannabinoid hyperemesis syndrome 10/27/2023   Cannabis use disorder 04/04/2023   Constipation 12/03/2021   Iron  deficiency anemia 11/07/2021   Moderate malnutrition (HCC) 08/11/2021   Nicotine  use disorder 08/11/2021   Cannabis hyperemesis syndrome concurrent with and due to cannabis dependence (HCC) 02/23/2021   Dysautonomia (HCC) 02/08/2021   Cyclic vomiting syndrome 12/28/2020   PCP:  Howell Lunger, DO Pharmacy:   [facility] [phone] - [place], [place] - [address] [facility] [address] [place] [place] [address] Phone: [phone] Fax: [phone]     Social Drivers of Health (SDOH)  Social History: SDOH Screenings   Food Insecurity: No Food Insecurity (10/28/2023)  Housing: High Risk (10/28/2023)  Transportation Needs: No Transportation Needs (10/28/2023)  Utilities: At Risk (10/28/2023)  Depression (PHQ2-9): Low Risk  (04/06/2023)  Recent Concern: Depression (PHQ2-9) - Medium Risk  (04/06/2023)  Financial Resource Strain: Low Risk  (12/29/2020)   [facility] from [facility]  Tobacco Use: Medium Risk (10/27/2023)  Health Literacy: Low Risk  (08/25/2021)   [facility] from [facility]   SDOH Interventions: Food Insecurity Interventions: Intervention Not Indicated, Inpatient TOC Housing Interventions: Community Resources Provided, Inpatient TOC Transportation Interventions: Intervention Not Indicated, Inpatient TOC Utilities Interventions: [employer] Provided, Inpatient TOC   Readmission Risk Interventions     No data to display

## 2023-12-04 ENCOUNTER — Encounter (HOSPITAL_COMMUNITY): Payer: Self-pay

## 2023-12-04 ENCOUNTER — Other Ambulatory Visit: Payer: Self-pay

## 2023-12-04 ENCOUNTER — Emergency Department (HOSPITAL_COMMUNITY)
Admission: EM | Admit: 2023-12-04 | Discharge: 2023-12-05 | Discharge status: Against Medical Advice | Attending: Emergency Medicine | Admitting: Emergency Medicine

## 2023-12-04 DIAGNOSIS — R112 Nausea with vomiting, unspecified: Secondary | ICD-10-CM | POA: Diagnosis not present

## 2023-12-04 DIAGNOSIS — R109 Unspecified abdominal pain: Secondary | ICD-10-CM | POA: Insufficient documentation

## 2023-12-04 DIAGNOSIS — Z5321 Procedure and treatment not carried out due to patient leaving prior to being seen by health care provider: Secondary | ICD-10-CM | POA: Diagnosis not present

## 2023-12-04 LAB — HCG, SERUM, QUALITATIVE: Preg, Serum: NEGATIVE

## 2023-12-04 LAB — CBC
HCT: 39.7 % (ref 36.0–46.0)
Hemoglobin: 12.7 g/dL (ref 12.0–15.0)
MCH: 27.8 pg (ref 26.0–34.0)
MCHC: 32 g/dL (ref 30.0–36.0)
MCV: 86.9 fL (ref 80.0–100.0)
Platelets: 476 K/uL — ABNORMAL HIGH (ref 150–400)
RBC: 4.57 MIL/uL (ref 3.87–5.11)
RDW: 17.4 % — ABNORMAL HIGH (ref 11.5–15.5)
WBC: 11.9 K/uL — ABNORMAL HIGH (ref 4.0–10.5)
nRBC: 0 % (ref 0.0–0.2)

## 2023-12-04 LAB — COMPREHENSIVE METABOLIC PANEL WITH GFR
ALT: 12 U/L (ref 0–44)
AST: 26 U/L (ref 15–41)
Albumin: 4.1 g/dL (ref 3.5–5.0)
Alkaline Phosphatase: 59 U/L (ref 38–126)
Anion gap: 12 (ref 5–15)
BUN: <5 mg/dL — ABNORMAL LOW (ref 6–20)
CO2: 21 mmol/L — ABNORMAL LOW (ref 22–32)
Calcium: 9.5 mg/dL (ref 8.9–10.3)
Chloride: 106 mmol/L (ref 98–111)
Creatinine, Ser: 0.61 mg/dL (ref 0.44–1.00)
GFR, Estimated: >60 mL/min (ref >60)
Glucose, Bld: 155 mg/dL — ABNORMAL HIGH (ref 70–99)
Potassium: 3.9 mmol/L (ref 3.5–5.1)
Sodium: 139 mmol/L (ref 135–145)
Total Bilirubin: 0.5 mg/dL (ref 0.0–1.2)
Total Protein: 7.7 g/dL (ref 6.5–8.1)

## 2023-12-04 LAB — LIPASE, BLOOD: Lipase: 24 U/L (ref 11–51)

## 2023-12-04 MED ORDER — ONDANSETRON 4 MG PO TBDP: 4.0000 mg | ORAL_TABLET | Freq: Once | ORAL | Status: DC

## 2023-12-04 MED ORDER — METOCLOPRAMIDE HCL 10 MG PO TABS: 10.0000 mg | ORAL_TABLET | Freq: Once | ORAL | Status: DC

## 2023-12-04 MED ORDER — ONDANSETRON HCL 4 MG/2ML IJ SOLN
4.0000 mg | Freq: Once | INTRAMUSCULAR | Status: AC
  Administered 2023-12-04 (×2): 4 mg via INTRAMUSCULAR
  Filled 2023-12-04: qty 2

## 2023-12-04 NOTE — ED Triage Notes (Signed)
 Pt c/o abd pain and N/V x 2 days; denies urinary symptoms

## 2023-12-04 NOTE — ED Provider Triage Note (Signed)
 Emergency Medicine Provider Triage Evaluation Note  Theresa Burnett , a 18 y.o. female  was evaluated in triage.  Pt complains of nausea, vomiting, abdominal pain. HX of cyclic vomiting, cannabis hyperemesis.  Review of Systems  Positive: Abdominal pain, nausea, vomiting Negative:   Physical Exam  BP (!) 150/111 (BP Location: Left Arm)   Pulse (!) 123   Temp 98.4 F (36.9 C)   Resp (!) 22   SpO2 100%  Gen:   Awake, no distress   Resp:  Normal effort  MSK:   Moves extremities without difficulty  Other:  Focal epigastric ttp  Medical Decision Making  Medically screening exam initiated at 7:33 PM.  Appropriate orders placed.  Theresa Burnett was informed that the remainder of the evaluation will be completed by another provider, this initial triage assessment does not replace that evaluation, and the importance of remaining in the ED until their evaluation is complete.  Workup initiated in triage    Theresa Burnett 12/04/23 1934

## 2023-12-05 NOTE — ED Notes (Signed)
Pt seen leaving  

## 2024-01-07 ENCOUNTER — Other Ambulatory Visit: Payer: Self-pay

## 2024-01-07 ENCOUNTER — Ambulatory Visit (HOSPITAL_COMMUNITY): Payer: Self-pay | Admitting: Physician Assistant

## 2024-01-07 ENCOUNTER — Ambulatory Visit (HOSPITAL_COMMUNITY)
Admission: EM | Admit: 2024-01-07 | Discharge: 2024-01-07 | Disposition: A | Discharge status: Home / Self Care | Attending: Physician Assistant | Admitting: Physician Assistant

## 2024-01-07 ENCOUNTER — Encounter (HOSPITAL_COMMUNITY): Payer: Self-pay | Admitting: *Deleted

## 2024-01-07 DIAGNOSIS — J069 Acute upper respiratory infection, unspecified: Secondary | ICD-10-CM | POA: Diagnosis not present

## 2024-01-07 DIAGNOSIS — R051 Acute cough: Secondary | ICD-10-CM

## 2024-01-07 LAB — POC SARS CORONAVIRUS 2 AG -  ED: SARS Coronavirus 2 Ag: NEGATIVE

## 2024-01-07 MED ORDER — PROMETHAZINE-DM 6.25-15 MG/5ML PO SYRP: 5.0000 mL | ORAL_SOLUTION | Freq: Two times a day (BID) | ORAL | 0 refills | Status: DC | PRN

## 2024-01-07 NOTE — ED Provider Notes (Signed)
 [facility]    CSN: [id number] Arrival date & time: 01/07/24  1434      History   Chief Complaint Chief Complaint  Patient presents with   Cough   Fever    HPI Lawren E Whitacre is a 18 y.o. female.   Patient presents today with a 3-day history of recurrent URI symptoms.  She reports sore throat, cough, shortness of breath with coughing, posttussive emesis.  Reports that she has been sick on and off for several weeks but was feeling better until a few days ago.  She denies any known sick contacts but does report that her aunt who lives in the same household has recently started developing symptoms and is also being evaluated.  She has had COVID with last episode several months ago.  She has never had COVID vaccines.  Denies history of asthma, COPD.She does smoke.  She has no concern for pregnancy.  Denies any recent antibiotics or steroids.  She is having difficulty with her daily duties as result of symptoms.    Past Medical History:  Diagnosis Date   H. pylori infection     Patient Active Problem List   Diagnosis Date Noted   Cannabinoid hyperemesis syndrome 10/27/2023   Cannabis use disorder 04/04/2023   Constipation 12/03/2021   Iron  deficiency anemia 11/07/2021   Moderate malnutrition (HCC) 08/11/2021   Nicotine  use disorder 08/11/2021   Cannabis hyperemesis syndrome concurrent with and due to cannabis dependence (HCC) 02/23/2021   Dysautonomia (HCC) 02/08/2021   Cyclic vomiting syndrome 12/28/2020    Past Surgical History:  Procedure Laterality Date   CHOLECYSTECTOMY      OB History     Gravida  0   Para  0   Term  0   Preterm  0   AB  0   Living  0      SAB  0   IAB  0   Ectopic  0   Multiple  0   Live Births  0            Home Medications    Prior to Admission medications   Medication Sig Start Date End Date Taking? Authorizing Provider  promethazine -dextromethorphan (PROMETHAZINE -DM) 6.25-15 MG/5ML syrup Take 5 mLs  by mouth 2 (two) times daily as needed for cough. 01/07/24  Yes Kalista Laguardia, Rocky POUR, PA-C    Family History Family History  Problem Relation Age of Onset   Kidney disease Maternal Grandmother    COPD Maternal Grandmother    Hypertension Maternal Grandmother    Diabetes Maternal Grandmother     Social History Social History   Tobacco Use   Smoking status: Former    Types: Cigarettes, E-cigarettes    Passive exposure: Current   Smokeless tobacco: Never  Vaping Use   Vaping status: Every Day   Substances: Flavoring  Substance Use Topics   Alcohol use: Never   Drug use: Yes    Types: Marijuana     Allergies   Patient has no known allergies.   Review of Systems Review of Systems  Constitutional:  Positive for activity change. Negative for appetite change, fatigue and fever.  HENT:  Positive for congestion. Negative for sinus pressure, sneezing and sore throat.   Respiratory:  Positive for cough and shortness of breath (with coughing).   Cardiovascular:  Negative for chest pain.  Gastrointestinal:  Positive for vomiting (Posttussive). Negative for abdominal pain, diarrhea and nausea.  Musculoskeletal:  Negative for arthralgias and myalgias.  Neurological:  Positive for headaches. Negative for dizziness and light-headedness.     Physical Exam Triage Vital Signs ED Triage Vitals  Encounter Vitals Group     BP 01/07/24 1706 99/65     Girls Systolic BP Percentile --      Girls Diastolic BP Percentile --      Boys Systolic BP Percentile --      Boys Diastolic BP Percentile --      Pulse Rate 01/07/24 1706 94     Resp 01/07/24 1706 16     Temp 01/07/24 1706 98.9 F (37.2 C)     Temp src --      SpO2 01/07/24 1706 96 %     Weight --      Height --      Head Circumference --      Peak Flow --      Pain Score 01/07/24 1703 6     Pain Loc --      Pain Education --      Exclude from Growth Chart --    No data found.  Updated Vital Signs BP 99/65   Pulse 94   Temp  98.9 F (37.2 C)   Resp 16   LMP 12/24/2023 (Approximate)   SpO2 96%   Visual Acuity Right Eye Distance:   Left Eye Distance:   Bilateral Distance:    Right Eye Near:   Left Eye Near:    Bilateral Near:     Physical Exam Vitals reviewed.  Constitutional:      General: Chance is awake. Suzannah is not in acute distress.    Appearance: Normal appearance. Shelly is well-developed. Claris is not ill-appearing.     Comments: Very pleasant female appears stated age in no acute distress sitting comfortably in exam room  HENT:     Head: Normocephalic and atraumatic.     Right Ear: Tympanic membrane, ear canal and external ear normal. Tympanic membrane is not erythematous or bulging.     Left Ear: Tympanic membrane, ear canal and external ear normal. Tympanic membrane is not erythematous or bulging.     Nose:     Right Sinus: No maxillary sinus tenderness or frontal sinus tenderness.     Left Sinus: No maxillary sinus tenderness or frontal sinus tenderness.     Mouth/Throat:     Pharynx: Uvula midline. No oropharyngeal exudate or posterior oropharyngeal erythema.  Cardiovascular:     Rate and Rhythm: Normal rate and regular rhythm.     Heart sounds: Normal heart sounds, S1 normal and S2 normal. No murmur heard. Pulmonary:     Effort: Pulmonary effort is normal.     Breath sounds: Normal breath sounds. No wheezing, rhonchi or rales.     Comments: Clear to auscultation bilaterally Psychiatric:        Behavior: Behavior is cooperative.      UC Treatments / Results  Labs (all labs ordered are listed, but only abnormal results are displayed) Labs Reviewed  POC SARS CORONAVIRUS 2 AG -  ED    EKG   Radiology No results found.  Procedures Procedures (including critical care time)  Medications Ordered in UC Medications - No data to display  Initial Impression / Assessment and Plan / UC Course  I have reviewed the triage vital signs and the nursing notes.  Pertinent  labs & imaging results that were available during my care of the patient were reviewed by me and considered in my medical decision making (see chart  for details).     Patient is well-appearing, afebrile, nontoxic, nontachycardic.  Vital signs and physical exam are reassuring with no indication for emergent evaluation or imaging.  Chest x-ray was deferred as she had no adventitious lung sounds on exam and oxygen saturation was 96%.  No evidence of acute infection and wanted initiation of antibiotics.  We discussed likely viral etiology of symptoms.  Initially offered COVID and flu testing but patient was concerned that she would not tolerate the nasal swab for flu testing and so ultimately preferred just COVID testing.  Unfortunately, she was unable to wait for these results but I had a low suspicion for COVID and so we developed a plan based on general viral URI and discussed that I will call her if this is positive.  Her test results came back negative and so this was communicated via MyChart.  She was given Promethazine  DM for cough and we discussed that this can be sedating.  She can use over-the-counter medication for additional symptom relief.  If she has any worsening or changing symptoms she needs to be seen immediately.  Strict return precautions given.  Excuse note provided.  Final Clinical Impressions(s) / UC Diagnoses   Final diagnoses:  Acute cough  Upper respiratory tract infection, unspecified type     Discharge Instructions      I will call you if you are positive for COVID.  I believe that you have a virus that is causing your symptoms.  Gargle with warm salt water and use Tylenol /ibuprofen for pain relief.  I also recommend nasal saline sinus rinses.  Take Promethazine  DM for cough.  This will make you sleepy so do not drive or drink alcohol with taking it.  If you are not feeling better within a week or if anything worsens and you have high fever, worsening cough, shortness of  breath, chest pain you need to be seen immediately.     ED Prescriptions     Medication Sig Dispense Auth. Provider   promethazine -dextromethorphan (PROMETHAZINE -DM) 6.25-15 MG/5ML syrup Take 5 mLs by mouth 2 (two) times daily as needed for cough. 50 mL Normal Recinos K, PA-C      PDMP not reviewed this encounter.   Sherrell Rocky POUR, PA-C 01/07/24 1824

## 2024-01-07 NOTE — ED Triage Notes (Addendum)
 PT reports she has a bad cough ,sore throat pt wants a COVID test . Pt reports having a weak immune system. Pt has body aches. Pt reports Sx's have been ongoing for 11/2 weeks.

## 2024-01-07 NOTE — ED Notes (Signed)
 Provider informed Pt wants to go home and have results of COVID test called to her.

## 2024-01-07 NOTE — Discharge Instructions (Addendum)
 I will call you if you are positive for COVID.  I believe that you have a virus that is causing your symptoms.  Gargle with warm salt water and use Tylenol /ibuprofen for pain relief.  I also recommend nasal saline sinus rinses.  Take Promethazine  DM for cough.  This will make you sleepy so do not drive or drink alcohol with taking it.  If you are not feeling better within a week or if anything worsens and you have high fever, worsening cough, shortness of breath, chest pain you need to be seen immediately.

## 2024-01-10 ENCOUNTER — Other Ambulatory Visit: Payer: Self-pay

## 2024-01-10 ENCOUNTER — Emergency Department (HOSPITAL_COMMUNITY)
Admission: EM | Admit: 2024-01-10 | Discharge: 2024-01-10 | Disposition: A | Discharge status: Home / Self Care | Attending: Emergency Medicine | Admitting: Emergency Medicine

## 2024-01-10 ENCOUNTER — Encounter (HOSPITAL_COMMUNITY): Payer: Self-pay

## 2024-01-10 DIAGNOSIS — R1110 Vomiting, unspecified: Secondary | ICD-10-CM | POA: Diagnosis not present

## 2024-01-10 DIAGNOSIS — Z79899 Other long term (current) drug therapy: Secondary | ICD-10-CM | POA: Insufficient documentation

## 2024-01-10 DIAGNOSIS — R1013 Epigastric pain: Secondary | ICD-10-CM | POA: Insufficient documentation

## 2024-01-10 DIAGNOSIS — F1290 Cannabis use, unspecified, uncomplicated: Secondary | ICD-10-CM | POA: Diagnosis not present

## 2024-01-10 DIAGNOSIS — R1010 Upper abdominal pain, unspecified: Secondary | ICD-10-CM | POA: Diagnosis not present

## 2024-01-10 LAB — COMPREHENSIVE METABOLIC PANEL WITH GFR
ALT: 9 U/L (ref 0–44)
AST: 21 U/L (ref 15–41)
Albumin: 5.1 g/dL — ABNORMAL HIGH (ref 3.5–5.0)
Alkaline Phosphatase: 79 U/L (ref 38–126)
Anion gap: 19 — ABNORMAL HIGH (ref 5–15)
BUN: 13 mg/dL (ref 6–20)
CO2: 25 mmol/L (ref 22–32)
Calcium: 10.6 mg/dL — ABNORMAL HIGH (ref 8.9–10.3)
Chloride: 99 mmol/L (ref 98–111)
Creatinine, Ser: 0.67 mg/dL (ref 0.44–1.00)
GFR, Estimated: >60 mL/min (ref >60)
Glucose, Bld: 123 mg/dL — ABNORMAL HIGH (ref 70–99)
Potassium: 3.5 mmol/L (ref 3.5–5.1)
Sodium: 143 mmol/L (ref 135–145)
Total Bilirubin: 0.6 mg/dL (ref 0.0–1.2)
Total Protein: 9.1 g/dL — ABNORMAL HIGH (ref 6.5–8.1)

## 2024-01-10 LAB — URINALYSIS, ROUTINE W REFLEX MICROSCOPIC
Bilirubin Urine: NEGATIVE
Glucose, UA: NEGATIVE mg/dL
Hgb urine dipstick: NEGATIVE
Ketones, ur: 20 mg/dL — AB
Leukocytes,Ua: NEGATIVE
Nitrite: NEGATIVE
Protein, ur: 100 mg/dL — AB
Specific Gravity, Urine: 1.033 — ABNORMAL HIGH (ref 1.005–1.030)
pH: 6 (ref 5.0–8.0)

## 2024-01-10 LAB — CBC
HCT: 43.7 % (ref 36.0–46.0)
Hemoglobin: 13.6 g/dL (ref 12.0–15.0)
MCH: 26.1 pg (ref 26.0–34.0)
MCHC: 31.1 g/dL (ref 30.0–36.0)
MCV: 83.7 fL (ref 80.0–100.0)
Platelets: 539 K/uL — ABNORMAL HIGH (ref 150–400)
RBC: 5.22 MIL/uL — ABNORMAL HIGH (ref 3.87–5.11)
RDW: 17.2 % — ABNORMAL HIGH (ref 11.5–15.5)
WBC: 9.4 K/uL (ref 4.0–10.5)
nRBC: 0 % (ref 0.0–0.2)

## 2024-01-10 LAB — HCG, SERUM, QUALITATIVE: Preg, Serum: NEGATIVE

## 2024-01-10 LAB — URINE DRUG SCREEN
Amphetamines: NEGATIVE
Barbiturates: NEGATIVE
Benzodiazepines: NEGATIVE
Cocaine: NEGATIVE
Fentanyl: NEGATIVE
Methadone Scn, Ur: NEGATIVE
Opiates: NEGATIVE
Tetrahydrocannabinol: POSITIVE — AB

## 2024-01-10 LAB — LIPASE, BLOOD: Lipase: 12 U/L (ref 11–51)

## 2024-01-10 MED ORDER — HYDROMORPHONE HCL 1 MG/ML IJ SOLN: 0.5000 mg | Freq: Once | INTRAMUSCULAR | Status: DC

## 2024-01-10 MED ORDER — ONDANSETRON HCL 4 MG/2ML IJ SOLN
4.0000 mg | Freq: Once | INTRAMUSCULAR | Status: AC
  Administered 2024-01-10: 4 mg via INTRAVENOUS
  Filled 2024-01-10: qty 2

## 2024-01-10 MED ORDER — ONDANSETRON 4 MG PO TBDP: ORAL_TABLET | ORAL | 0 refills | Status: DC

## 2024-01-10 MED ORDER — ONDANSETRON HCL 4 MG/2ML IJ SOLN: 4.0000 mg | Freq: Once | INTRAMUSCULAR | Status: DC

## 2024-01-10 MED ORDER — TRAMADOL HCL 50 MG PO TABS: 50.0000 mg | ORAL_TABLET | Freq: Four times a day (QID) | ORAL | 0 refills | Status: DC | PRN

## 2024-01-10 MED ORDER — PANTOPRAZOLE SODIUM 40 MG IV SOLR
40.0000 mg | Freq: Once | INTRAVENOUS | Status: AC
  Administered 2024-01-10: 40 mg via INTRAVENOUS
  Filled 2024-01-10: qty 10

## 2024-01-10 MED ORDER — HYDROMORPHONE HCL 1 MG/ML IJ SOLN
0.5000 mg | Freq: Once | INTRAMUSCULAR | Status: AC
  Administered 2024-01-10: 0.5 mg via INTRAVENOUS
  Filled 2024-01-10: qty 1

## 2024-01-10 MED ORDER — PANTOPRAZOLE SODIUM 20 MG PO TBEC: 20.0000 mg | DELAYED_RELEASE_TABLET | Freq: Every day | ORAL | 0 refills | Status: DC

## 2024-01-10 MED ORDER — SODIUM CHLORIDE 0.9 % IV BOLUS
1000.0000 mL | Freq: Once | INTRAVENOUS | Status: AC
  Administered 2024-01-10: 1000 mL via INTRAVENOUS

## 2024-01-10 NOTE — ED Provider Notes (Signed)
 [place] [facility] AT [facility] Provider Note   CSN: [id number] Arrival date & time: 01/10/24  9248     Patient presents with: Abdominal Pain   Theresa Burnett is a 18 y.o. female.   Patient complains of vomiting and epigastric abdominal pain.  Patient has been seen before for the same symptoms and it was decided her symptoms were most likely due to marijuana use  The history is provided by the patient and medical records. No language interpreter was used.  Abdominal Pain Pain location:  Epigastric Pain quality: aching   Pain radiates to:  Does not radiate Pain severity:  Moderate Onset quality:  Sudden Timing:  Constant Progression:  Worsening Chronicity:  New Context: not alcohol use   Relieved by:  Nothing Worsened by:  Nothing Associated symptoms: no chest pain, no cough, no diarrhea, no fatigue and no hematuria        Prior to Admission medications   Medication Sig Start Date End Date Taking? Authorizing Provider  ondansetron  (ZOFRAN -ODT) 4 MG disintegrating tablet 4mg  ODT q4 hours prn nausea/vomit 01/10/24  Yes Damauri Minion, MD  pantoprazole  (PROTONIX ) 20 MG tablet Take 1 tablet (20 mg total) by mouth daily. 01/10/24  Yes Arieonna Medine, MD  traMADol  (ULTRAM ) 50 MG tablet Take 1 tablet (50 mg total) by mouth every 6 (six) hours as needed. 01/10/24  Yes Jaspreet Hollings, MD  promethazine -dextromethorphan (PROMETHAZINE -DM) 6.25-15 MG/5ML syrup Take 5 mLs by mouth 2 (two) times daily as needed for cough. 01/07/24   Raspet, Erin K, PA-C    Allergies: Patient has no known allergies.    Review of Systems  Constitutional:  Negative for appetite change and fatigue.  HENT:  Negative for congestion, ear discharge and sinus pressure.   Eyes:  Negative for discharge.  Respiratory:  Negative for cough.   Cardiovascular:  Negative for chest pain.  Gastrointestinal:  Positive for abdominal pain. Negative for diarrhea.  Genitourinary:  Negative for  frequency and hematuria.  Musculoskeletal:  Negative for back pain.  Skin:  Negative for rash.  Neurological:  Negative for seizures and headaches.  Psychiatric/Behavioral:  Negative for hallucinations.     Updated Vital Signs BP 124/75 (BP Location: Left Arm)   Pulse 99   Temp 98.9 F (37.2 C) (Oral)   Resp 19   Ht 5' 3 (1.6 m)   Wt 46.3 kg   LMP 12/24/2023 (Approximate)   SpO2 99%   BMI 18.07 kg/m   Physical Exam Vitals and nursing note reviewed.  Constitutional:      Appearance: Theresa Burnett is well-developed.  HENT:     Head: Normocephalic.     Nose: Nose normal.  Eyes:     General: No scleral icterus.    Conjunctiva/sclera: Conjunctivae normal.  Neck:     Thyroid: No thyromegaly.  Cardiovascular:     Rate and Rhythm: Normal rate and regular rhythm.     Heart sounds: No murmur heard.    No friction rub. No gallop.  Pulmonary:     Breath sounds: No stridor. No wheezing or rales.  Chest:     Chest wall: No tenderness.  Abdominal:     General: There is no distension.     Tenderness: There is abdominal tenderness. There is no rebound.  Musculoskeletal:        General: Normal range of motion.     Cervical back: Neck supple.  Lymphadenopathy:     Cervical: No cervical adenopathy.  Skin:  Findings: No erythema or rash.  Neurological:     Mental Status: Theresa Burnett is alert and oriented to person, place, and time.     Motor: No abnormal muscle tone.     Coordination: Coordination normal.  Psychiatric:        Behavior: Behavior normal.     (all labs ordered are listed, but only abnormal results are displayed) Labs Reviewed  COMPREHENSIVE METABOLIC PANEL WITH GFR - Abnormal; Notable for the following components:      Result Value   Glucose, Bld 123 (*)    Calcium  10.6 (*)    Total Protein 9.1 (*)    Albumin 5.1 (*)    Anion gap 19 (*)    All other components within normal limits  CBC - Abnormal; Notable for the following components:   RBC 5.22 (*)    RDW  17.2 (*)    Platelets 539 (*)    All other components within normal limits  URINALYSIS, ROUTINE W REFLEX MICROSCOPIC - Abnormal; Notable for the following components:   APPearance HAZY (*)    Specific Gravity, Urine 1.033 (*)    Ketones, ur 20 (*)    Protein, ur 100 (*)    Bacteria, UA RARE (*)    All other components within normal limits  URINE DRUG SCREEN - Abnormal; Notable for the following components:   Tetrahydrocannabinol POSITIVE (*)    All other components within normal limits  LIPASE, BLOOD  HCG, SERUM, QUALITATIVE    EKG: None  Radiology: No results found.   Procedures   Medications Ordered in the ED  ondansetron  (ZOFRAN ) injection 4 mg (4 mg Intravenous Not Given 01/10/24 1215)  HYDROmorphone  (DILAUDID ) injection 0.5 mg (0.5 mg Intravenous Not Given 01/10/24 1400)  sodium chloride  0.9 % bolus 1,000 mL (0 mLs Intravenous Stopped 01/10/24 1015)  ondansetron  (ZOFRAN ) injection 4 mg (4 mg Intravenous Given 01/10/24 0918)  pantoprazole  (PROTONIX ) injection 40 mg (40 mg Intravenous Given 01/10/24 0917)  HYDROmorphone  (DILAUDID ) injection 0.5 mg (0.5 mg Intravenous Given 01/10/24 0935)  sodium chloride  0.9 % bolus 1,000 mL (0 mLs Intravenous Stopped 01/10/24 1330)                                    Medical Decision Making Amount and/or Complexity of Data Reviewed Labs: ordered.  Risk Prescription drug management.   Hyperemesis and abdominal pain from marijuana use.  Patient improved with treatment in the emergency department and is sent home with tramadol  Zofran  and Protonix  and will follow-up with her PCP     Final diagnoses:  Pain of upper abdomen    ED Discharge Orders          Ordered    traMADol  (ULTRAM ) 50 MG tablet  Every 6 hours PRN        01/10/24 1410    ondansetron  (ZOFRAN -ODT) 4 MG disintegrating tablet        01/10/24 1410    pantoprazole  (PROTONIX ) 20 MG tablet  Daily        01/10/24 1410               Suzette Pac, MD 01/10/24  1743

## 2024-01-10 NOTE — Discharge Instructions (Addendum)
 Follow-up with either your family doctor or [place] [facility].  Stop using marijuana.  Return if problems

## 2024-01-10 NOTE — ED Notes (Signed)
 Patient informed that urine sample is needed. Patient states that she does not need to void at this time.

## 2024-01-10 NOTE — ED Triage Notes (Signed)
 Pt BIB [facility] ED from home C/O abdominal pain, vomiting, cough X 2 days.   CBG 131 HR 122 16 140/98

## 2024-01-10 NOTE — ED Notes (Signed)
 Pt aware of needed urine sample. States she is unable to give a sample at this time.

## 2024-01-15 ENCOUNTER — Telehealth: Payer: Self-pay

## 2024-01-15 DIAGNOSIS — R1115 Cyclical vomiting syndrome unrelated to migraine: Secondary | ICD-10-CM

## 2024-01-15 MED ORDER — AMITRIPTYLINE HCL 50 MG PO TABS: 50.0000 mg | ORAL_TABLET | Freq: Every day | ORAL | 1 refills | Status: DC

## 2024-01-15 NOTE — Telephone Encounter (Signed)
 Received request to refill amitriptyline .  Per last PCP note amitriptyline  50 mg prescribed nightly to help with cyclic vomiting as well as anxiety/depression.  Refilled amitriptyline  50 mg nightly.

## 2024-01-15 NOTE — Telephone Encounter (Signed)
 Fax from [facility]  for AMITRIPTYLINE  HCL 25mg  tab. I dont see on current medication list. If you want patient to start this medication.Please send Rx to pharmacy with instruction, duration and dose.  Nelson Land, CMA

## 2024-01-17 ENCOUNTER — Other Ambulatory Visit: Payer: Self-pay

## 2024-01-17 DIAGNOSIS — R1115 Cyclical vomiting syndrome unrelated to migraine: Secondary | ICD-10-CM

## 2024-02-05 ENCOUNTER — Telehealth: Payer: Self-pay | Admitting: Family Medicine

## 2024-02-05 NOTE — Telephone Encounter (Signed)
 Completed [place] [facility] request for prior approval CMN/PA form enteral formula (ensure) due to cyclical vomiting syndrome.   Patient needs an appointment to discuss the medical necessity before form can be faxed back. I have placed the form in PCP box until this appt has been created.  Admin team: can we get patient scheduled for follow up? Thank you!

## 2024-02-11 ENCOUNTER — Ambulatory Visit: Admitting: Student

## 2024-02-19 ENCOUNTER — Other Ambulatory Visit: Payer: Self-pay | Admitting: Student

## 2024-02-19 DIAGNOSIS — R1115 Cyclical vomiting syndrome unrelated to migraine: Secondary | ICD-10-CM

## 2024-04-11 ENCOUNTER — Emergency Department (HOSPITAL_COMMUNITY)

## 2024-04-11 ENCOUNTER — Encounter (HOSPITAL_COMMUNITY): Payer: Self-pay

## 2024-04-11 ENCOUNTER — Emergency Department (HOSPITAL_COMMUNITY)
Admission: EM | Admit: 2024-04-11 | Discharge: 2024-04-11 | Disposition: A | Discharge status: Home / Self Care | Attending: Emergency Medicine | Admitting: Emergency Medicine

## 2024-04-11 ENCOUNTER — Other Ambulatory Visit: Payer: Self-pay

## 2024-04-11 DIAGNOSIS — R000 Tachycardia, unspecified: Secondary | ICD-10-CM | POA: Diagnosis not present

## 2024-04-11 DIAGNOSIS — R7309 Other abnormal glucose: Secondary | ICD-10-CM | POA: Diagnosis not present

## 2024-04-11 DIAGNOSIS — R1084 Generalized abdominal pain: Secondary | ICD-10-CM | POA: Insufficient documentation

## 2024-04-11 DIAGNOSIS — R109 Unspecified abdominal pain: Secondary | ICD-10-CM

## 2024-04-11 DIAGNOSIS — R112 Nausea with vomiting, unspecified: Secondary | ICD-10-CM | POA: Diagnosis present

## 2024-04-11 LAB — CBC WITH DIFFERENTIAL/PLATELET
Abs Immature Granulocytes: 0.02 K/uL (ref 0.00–0.07)
Basophils Absolute: 0 K/uL (ref 0.0–0.1)
Basophils Relative: 0 %
Eosinophils Absolute: 0 K/uL (ref 0.0–0.5)
Eosinophils Relative: 0 %
HCT: 44 % (ref 36.0–46.0)
Hemoglobin: 14.2 g/dL (ref 12.0–15.0)
Immature Granulocytes: 0 %
Lymphocytes Relative: 6 %
Lymphs Abs: 0.4 K/uL — ABNORMAL LOW (ref 0.7–4.0)
MCH: 27.3 pg (ref 26.0–34.0)
MCHC: 32.3 g/dL (ref 30.0–36.0)
MCV: 84.5 fL (ref 80.0–100.0)
Monocytes Absolute: 0.6 K/uL (ref 0.1–1.0)
Monocytes Relative: 8 %
Neutro Abs: 6.4 K/uL (ref 1.7–7.7)
Neutrophils Relative %: 86 %
Platelets: 349 K/uL (ref 150–400)
RBC: 5.21 MIL/uL — ABNORMAL HIGH (ref 3.87–5.11)
RDW: 16.3 % — ABNORMAL HIGH (ref 11.5–15.5)
WBC: 7.4 K/uL (ref 4.0–10.5)
nRBC: 0 % (ref 0.0–0.2)

## 2024-04-11 LAB — COMPREHENSIVE METABOLIC PANEL WITH GFR
ALT: 22 U/L (ref 0–44)
AST: 37 U/L (ref 15–41)
Albumin: 4.7 g/dL (ref 3.5–5.0)
Alkaline Phosphatase: 69 U/L (ref 38–126)
Anion gap: 13 (ref 5–15)
BUN: 16 mg/dL (ref 6–20)
CO2: 26 mmol/L (ref 22–32)
Calcium: 9 mg/dL (ref 8.9–10.3)
Chloride: 96 mmol/L — ABNORMAL LOW (ref 98–111)
Creatinine, Ser: 0.65 mg/dL (ref 0.44–1.00)
GFR, Estimated: >60 mL/min
Glucose, Bld: 107 mg/dL — ABNORMAL HIGH (ref 70–99)
Potassium: 4.5 mmol/L (ref 3.5–5.1)
Sodium: 135 mmol/L (ref 135–145)
Total Bilirubin: 0.3 mg/dL (ref 0.0–1.2)
Total Protein: 8.9 g/dL — ABNORMAL HIGH (ref 6.5–8.1)

## 2024-04-11 LAB — URINE DRUG SCREEN
Amphetamines: NEGATIVE
Barbiturates: NEGATIVE
Benzodiazepines: NEGATIVE
Cocaine: NEGATIVE
Fentanyl: NEGATIVE
Methadone Scn, Ur: NEGATIVE
Opiates: POSITIVE — AB
Tetrahydrocannabinol: POSITIVE — AB

## 2024-04-11 LAB — CBG MONITORING, ED: Glucose-Capillary: 127 mg/dL — ABNORMAL HIGH (ref 70–99)

## 2024-04-11 LAB — HCG, SERUM, QUALITATIVE: Preg, Serum: NEGATIVE

## 2024-04-11 LAB — LIPASE, BLOOD: Lipase: 13 U/L (ref 11–51)

## 2024-04-11 MED ORDER — SODIUM CHLORIDE 0.9 % IV BOLUS
500.0000 mL | Freq: Once | INTRAVENOUS | Status: AC
  Administered 2024-04-11: 500 mL via INTRAVENOUS

## 2024-04-11 MED ORDER — NALOXONE HCL 4 MG/0.1ML NA LIQD
NASAL | Status: AC
  Administered 2024-04-11: 4 mg
  Filled 2024-04-11: qty 4

## 2024-04-11 MED ORDER — SODIUM CHLORIDE 0.9 % IV BOLUS: 1000.0000 mL | Freq: Once | INTRAVENOUS | Status: DC

## 2024-04-11 MED ORDER — DROPERIDOL 2.5 MG/ML IJ SOLN
1.2500 mg | Freq: Once | INTRAMUSCULAR | Status: AC
  Administered 2024-04-11: 1.25 mg via INTRAVENOUS
  Filled 2024-04-11: qty 2

## 2024-04-11 MED ORDER — MORPHINE SULFATE (PF) 4 MG/ML IV SOLN
4.0000 mg | Freq: Once | INTRAVENOUS | Status: AC
  Administered 2024-04-11: 4 mg via INTRAVENOUS
  Filled 2024-04-11: qty 1

## 2024-04-11 MED ORDER — NALOXONE HCL 0.4 MG/ML IJ SOLN: 0.4000 mg | Freq: Once | INTRAMUSCULAR | Status: DC

## 2024-04-11 MED ORDER — IOHEXOL 300 MG/ML  SOLN
100.0000 mL | Freq: Once | INTRAMUSCULAR | Status: AC | PRN
  Administered 2024-04-11: 100 mL via INTRAVENOUS

## 2024-04-11 MED ORDER — SODIUM CHLORIDE 0.9 % IV BOLUS: 500.0000 mL | Freq: Once | INTRAVENOUS | Status: DC

## 2024-04-11 MED ORDER — SODIUM CHLORIDE 0.9 % IV BOLUS
1000.0000 mL | Freq: Once | INTRAVENOUS | Status: AC
  Administered 2024-04-11: 1000 mL via INTRAVENOUS

## 2024-04-11 NOTE — ED Notes (Signed)
 Pt stating that she is not going to stay because she doesn't have her charger or a change of clothes. Discussed with pt the risks of leaving AMA, pt states she understands, but has decided to leave.

## 2024-04-11 NOTE — ED Provider Notes (Signed)
 " [place] EMERGENCY DEPARTMENT AT [facility] Provider Note   CSN: [id number] Arrival date & time: 04/11/24  9267     Patient presents with: No chief complaint on file.  HPI Theresa Burnett is a 18 y.o. female with H/O CHS, iron  deficiency anemia presenting for abdominal pain nausea and vomiting.  Has been going on for 2 days.  Abdominal pain is all over the abdomen.  Denies chest pain or shortness of breath.  Last vomited just before she arrived.  Has been using marijuana daily.  Denies urinary or vaginal symptoms.  Normal bowel movement yesterday.  {Add pertinent medical, surgical, social history, OB history to HPI:32947} HPI     Prior to Admission medications  Medication Sig Start Date End Date Taking? Authorizing Provider  amitriptyline  (ELAVIL ) 50 MG tablet TAKE 1 TABLET BY MOUTH EVERYDAY AT BEDTIME 02/19/24   Howell Lunger, DO  ondansetron  (ZOFRAN -ODT) 4 MG disintegrating tablet 4mg  ODT q4 hours prn nausea/vomit 01/10/24   Zammit, Joseph, MD  pantoprazole  (PROTONIX ) 20 MG tablet Take 1 tablet (20 mg total) by mouth daily. 01/10/24   Zammit, Joseph, MD  promethazine -dextromethorphan (PROMETHAZINE -DM) 6.25-15 MG/5ML syrup Take 5 mLs by mouth 2 (two) times daily as needed for cough. 01/07/24   Raspet, Erin K, PA-C  traMADol  (ULTRAM ) 50 MG tablet Take 1 tablet (50 mg total) by mouth every 6 (six) hours as needed. 01/10/24   Suzette Pac, MD    Allergies: Patient has no known allergies.    Review of Systems See HPI  Updated Vital Signs BP (!) 151/119 (BP Location: Left Arm)   Pulse (!) 128   Temp 98.2 F (36.8 C) (Oral)   Resp 16   Ht 5' 3 (1.6 m)   Wt 46.3 kg   SpO2 100%   BMI 18.07 kg/m   Physical Exam Vitals and nursing note reviewed.  HENT:     Head: Normocephalic and atraumatic.     Mouth/Throat:     Mouth: Mucous membranes are moist.  Eyes:     General:        Right eye: No discharge.        Left eye: No discharge.     Conjunctiva/sclera:  Conjunctivae normal.  Cardiovascular:     Rate and Rhythm: Regular rhythm. Tachycardia present.     Pulses: Normal pulses.     Heart sounds: Normal heart sounds.  Pulmonary:     Effort: Pulmonary effort is normal.     Breath sounds: Normal breath sounds.  Abdominal:     General: Abdomen is flat.     Palpations: Abdomen is soft.     Tenderness: There is generalized abdominal tenderness.  Skin:    General: Skin is warm and dry.  Neurological:     General: No focal deficit present.  Psychiatric:        Mood and Affect: Mood normal.     (all labs ordered are listed, but only abnormal results are displayed) Labs Reviewed - No data to display  EKG: None  Radiology: No results found.  {Document cardiac monitor, telemetry assessment procedure when appropriate:32947} Procedures   Medications Ordered in the ED - No data to display    {Click here for ABCD2, HEART and other calculators REFRESH Note before signing:1}                              Medical Decision Making  ***  {Document critical  care time when appropriate  Document review of labs and clinical decision tools ie CHADS2VASC2, etc  Document your independent review of radiology images and any outside records  Document your discussion with family members, caretakers and with consultants  Document social determinants of health affecting pt's care  Document your decision making why or why not admission, treatments were needed:32947:::1}   Final diagnoses:  None    ED Discharge Orders     None        "

## 2024-04-11 NOTE — ED Notes (Signed)
 Pt A&O requested pain medication for abdominal pain. Notified PA

## 2024-04-11 NOTE — ED Notes (Signed)
 Pt having increasing lethargy, tachycardia with desatting to 80s, applied 2L [place] with rebound to 95%  EDP at bedside

## 2024-04-11 NOTE — ED Notes (Signed)
 Pt reassessed; continues to c/o abdominal pain 8/10. Second bag of IV fluids running. HR in the 120s. PA notified

## 2024-04-11 NOTE — ED Notes (Signed)
 Pt is more alert & responsive, able to answer questions. Will continue to monitor.

## 2024-04-11 NOTE — ED Provider Notes (Signed)
 I provided a substantive portion of the care of this patient.  I personally made/approved the management plan for this patient and take responsibility for the patient management.  EKG Interpretation Date/Time:  Friday April 11 2024 07:40:48 EST Ventricular Rate:  121 PR Interval:  125 QRS Duration:  74 QT Interval:  332 QTC Calculation: 471 R Axis:   85  Text Interpretation: Sinus tachycardia LAE, consider biatrial enlargement Confirmed by Dasie Faden ([id number]) on 04/11/2024 8:16:20 AM   The patient is here nausea vomit abdominal pain.  Admits to cannabis use.  Was given droperidol  and became less responsive.  She is able to be awakened.  Repeat CBG was 120.  Given Narcan  without response.  Suspect response to droperidol .  Protecting her airway.  Will continue to monitor   Dasie Faden, MD 04/11/24 [phone]

## 2024-04-11 NOTE — ED Triage Notes (Signed)
 Pt BIBA from home, c/o N&V and abdominal pain. Per EMS pt states she smoked marijuana 2 days ago & abdominal pain started from then. Pt is currently nauseous. Abd pain 8/10. Denies SOB.

## 2024-05-21 ENCOUNTER — Encounter (HOSPITAL_COMMUNITY): Payer: Self-pay | Admitting: Emergency Medicine

## 2024-05-21 ENCOUNTER — Other Ambulatory Visit: Payer: Self-pay

## 2024-05-21 ENCOUNTER — Inpatient Hospital Stay (HOSPITAL_COMMUNITY)
Admission: EM | Admit: 2024-05-21 | Discharge: 2024-05-23 | DRG: 392 | Discharge status: Against Medical Advice | Attending: Internal Medicine | Admitting: Internal Medicine

## 2024-05-21 DIAGNOSIS — Z825 Family history of asthma and other chronic lower respiratory diseases: Secondary | ICD-10-CM

## 2024-05-21 DIAGNOSIS — R112 Nausea with vomiting, unspecified: Principal | ICD-10-CM | POA: Diagnosis present

## 2024-05-21 DIAGNOSIS — D751 Secondary polycythemia: Secondary | ICD-10-CM | POA: Diagnosis present

## 2024-05-21 DIAGNOSIS — R000 Tachycardia, unspecified: Secondary | ICD-10-CM | POA: Diagnosis present

## 2024-05-21 DIAGNOSIS — R1116 Cannabis hyperemesis syndrome: Principal | ICD-10-CM | POA: Diagnosis present

## 2024-05-21 DIAGNOSIS — E876 Hypokalemia: Principal | ICD-10-CM | POA: Diagnosis present

## 2024-05-21 DIAGNOSIS — Z833 Family history of diabetes mellitus: Secondary | ICD-10-CM

## 2024-05-21 DIAGNOSIS — K219 Gastro-esophageal reflux disease without esophagitis: Secondary | ICD-10-CM | POA: Diagnosis present

## 2024-05-21 DIAGNOSIS — G901 Familial dysautonomia [Riley-Day]: Secondary | ICD-10-CM | POA: Diagnosis present

## 2024-05-21 DIAGNOSIS — Z79899 Other long term (current) drug therapy: Secondary | ICD-10-CM

## 2024-05-21 DIAGNOSIS — Z5329 Procedure and treatment not carried out because of patient's decision for other reasons: Secondary | ICD-10-CM | POA: Diagnosis present

## 2024-05-21 DIAGNOSIS — R1084 Generalized abdominal pain: Secondary | ICD-10-CM | POA: Diagnosis present

## 2024-05-21 DIAGNOSIS — Z87891 Personal history of nicotine dependence: Secondary | ICD-10-CM

## 2024-05-21 DIAGNOSIS — Z8249 Family history of ischemic heart disease and other diseases of the circulatory system: Secondary | ICD-10-CM

## 2024-05-21 DIAGNOSIS — E860 Dehydration: Secondary | ICD-10-CM | POA: Diagnosis present

## 2024-05-21 DIAGNOSIS — D75839 Thrombocytosis, unspecified: Secondary | ICD-10-CM | POA: Diagnosis present

## 2024-05-21 LAB — CBC
HCT: 50 % — ABNORMAL HIGH (ref 36.0–46.0)
Hemoglobin: 16 g/dL — ABNORMAL HIGH (ref 12.0–15.0)
MCH: 27.2 pg (ref 26.0–34.0)
MCHC: 32 g/dL (ref 30.0–36.0)
MCV: 85 fL (ref 80.0–100.0)
Platelets: 752 10*3/uL — ABNORMAL HIGH (ref 150–400)
RBC: 5.88 MIL/uL — ABNORMAL HIGH (ref 3.87–5.11)
RDW: 17.6 % — ABNORMAL HIGH (ref 11.5–15.5)
WBC: 8.6 10*3/uL (ref 4.0–10.5)
nRBC: 0 % (ref 0.0–0.2)

## 2024-05-21 LAB — COMPREHENSIVE METABOLIC PANEL WITH GFR
ALT: 7 U/L (ref 0–44)
AST: 22 U/L (ref 15–41)
Albumin: 5 g/dL (ref 3.5–5.0)
Alkaline Phosphatase: 96 U/L (ref 38–126)
Anion gap: 21 — ABNORMAL HIGH (ref 5–15)
BUN: 16 mg/dL (ref 6–20)
CO2: 30 mmol/L (ref 22–32)
Calcium: 10.6 mg/dL — ABNORMAL HIGH (ref 8.9–10.3)
Chloride: 87 mmol/L — ABNORMAL LOW (ref 98–111)
Creatinine, Ser: 0.66 mg/dL (ref 0.44–1.00)
GFR, Estimated: >60 mL/min
Glucose, Bld: 139 mg/dL — ABNORMAL HIGH (ref 70–99)
Potassium: 2.6 mmol/L — CL (ref 3.5–5.1)
Sodium: 139 mmol/L (ref 135–145)
Total Bilirubin: 0.7 mg/dL (ref 0.0–1.2)
Total Protein: 9.4 g/dL — ABNORMAL HIGH (ref 6.5–8.1)

## 2024-05-21 LAB — LIPASE, BLOOD: Lipase: 11 U/L (ref 11–51)

## 2024-05-21 LAB — HCG, SERUM, QUALITATIVE: Preg, Serum: NEGATIVE

## 2024-05-21 MED ORDER — ONDANSETRON HCL 4 MG/2ML IJ SOLN
4.0000 mg | Freq: Once | INTRAMUSCULAR | Status: AC | PRN
  Administered 2024-05-21: 4 mg via INTRAVENOUS
  Filled 2024-05-21: qty 2

## 2024-05-21 MED ORDER — DROPERIDOL 2.5 MG/ML IJ SOLN
1.2500 mg | Freq: Once | INTRAMUSCULAR | Status: AC
  Administered 2024-05-22: 1.25 mg via INTRAVENOUS
  Filled 2024-05-21: qty 2

## 2024-05-21 MED ORDER — LACTATED RINGERS IV BOLUS
1000.0000 mL | Freq: Once | INTRAVENOUS | Status: AC
  Administered 2024-05-22: 1000 mL via INTRAVENOUS

## 2024-05-21 MED ORDER — POTASSIUM CHLORIDE 10 MEQ/100ML IV SOLN
10.0000 meq | INTRAVENOUS | Status: AC
  Administered 2024-05-22 (×5): 10 meq via INTRAVENOUS
  Filled 2024-05-21 (×5): qty 100

## 2024-05-21 NOTE — ED Notes (Signed)
PT aware of urine sample 

## 2024-05-21 NOTE — ED Provider Notes (Incomplete)
 " [place] EMERGENCY DEPARTMENT AT [facility] Provider Note   CSN: [id number] Arrival date & time: 05/21/24  2012     Patient presents with: Emesis   Theresa Burnett is a 19 y.o. female with history of dysautonomia, cyclic vomiting syndrome, cannabis hyperemesis syndrome, kidney disorder, iron -deficiency anemia.  Presents to ED with chief complaint of nausea, vomiting, abdominal pain.  Patient with known history of cannabis hyperemesis syndrome, reports that she has not smoked weed in 1 week.  She states that she has had 1 week of persistent nausea, vomiting.  She denies any diarrhea.  Reports she is abdominal pain when she throws up but no abdominal pain at rest.  Denies any chest pain or shortness of breath.  Denies fevers at home, dysuria.  Denies any other drugs or alcohol.   Emesis      Prior to Admission medications  Medication Sig Start Date End Date Taking? Authorizing Provider  amitriptyline  (ELAVIL ) 50 MG tablet TAKE 1 TABLET BY MOUTH EVERYDAY AT BEDTIME 02/19/24   Howell Lunger, DO  ondansetron  (ZOFRAN -ODT) 4 MG disintegrating tablet 4mg  ODT q4 hours prn nausea/vomit 01/10/24   Zammit, Joseph, MD  pantoprazole  (PROTONIX ) 20 MG tablet Take 1 tablet (20 mg total) by mouth daily. 01/10/24   Zammit, Joseph, MD  promethazine -dextromethorphan (PROMETHAZINE -DM) 6.25-15 MG/5ML syrup Take 5 mLs by mouth 2 (two) times daily as needed for cough. 01/07/24   Raspet, Erin K, PA-C  traMADol  (ULTRAM ) 50 MG tablet Take 1 tablet (50 mg total) by mouth every 6 (six) hours as needed. 01/10/24   Suzette Pac, MD    Allergies: Patient has no known allergies.    Review of Systems  Gastrointestinal:  Positive for vomiting.    Updated Vital Signs BP (!) 144/108   Pulse (!) 143   Temp (!) 97.4 F (36.3 C) (Oral)   Resp (!) 24   Ht 5' 3 (1.6 m)   LMP  (LMP Unknown)   SpO2 100%   BMI 18.08 kg/m   Physical Exam  (all labs ordered are listed, but only abnormal results are  displayed) Labs Reviewed  COMPREHENSIVE METABOLIC PANEL WITH GFR - Abnormal; Notable for the following components:      Result Value   Potassium 2.6 (*)    Chloride 87 (*)    Glucose, Bld 139 (*)    Calcium  10.6 (*)    Total Protein 9.4 (*)    Anion gap 21 (*)    All other components within normal limits  CBC - Abnormal; Notable for the following components:   RBC 5.88 (*)    Hemoglobin 16.0 (*)    HCT 50.0 (*)    RDW 17.6 (*)    Platelets 752 (*)    All other components within normal limits  LIPASE, BLOOD  HCG, SERUM, QUALITATIVE  URINALYSIS, ROUTINE W REFLEX MICROSCOPIC    EKG: EKG Interpretation Date/Time:  Wednesday May 21 2024 23:42:59 EST Ventricular Rate:  143 PR Interval:  112 QRS Duration:  68 QT Interval:  267 QTC Calculation: 412 R Axis:   95  Text Interpretation: Sinus tachycardia LAE, consider biatrial enlargement Borderline right axis deviation Repol abnrm suggests ischemia, diffuse leads Confirmed by Trine Likes [id number]) on 05/21/2024 11:45:58 PM  Radiology: No results found.  {Document cardiac monitor, telemetry assessment procedure when appropriate:32947} Procedures   Medications Ordered in the ED  lactated ringers  bolus 1,000 mL (has no administration in time range)  potassium chloride  10 mEq in 100 mL  IVPB (has no administration in time range)  droperidol  (INAPSINE ) 2.5 MG/ML injection 1.25 mg (has no administration in time range)  ondansetron  (ZOFRAN ) injection 4 mg (4 mg Intravenous Given 05/21/24 2137)      {Click here for ABCD2, HEART and other calculators REFRESH Note before signing:1}                              Medical Decision Making Amount and/or Complexity of Data Reviewed Labs: ordered. ECG/medicine tests: ordered.  Risk Prescription drug management.   ***  {Document critical care time when appropriate  Document review of labs and clinical decision tools ie CHADS2VASC2, etc  Document your independent review of  radiology images and any outside records  Document your discussion with family members, caretakers and with consultants  Document social determinants of health affecting pt's care  Document your decision making why or why not admission, treatments were needed:32947:::1}   Final diagnoses:  None    ED Discharge Orders     None        "

## 2024-05-21 NOTE — ED Triage Notes (Signed)
 Pt c/o emesis and abd pain x 1 week, denies fever

## 2024-05-21 NOTE — ED Provider Notes (Signed)
 " [place] EMERGENCY DEPARTMENT AT [facility] Provider Note   CSN: [id number] Arrival date & time: 05/21/24  2012     Patient presents with: Emesis   Theresa Burnett is a 19 y.o. female with history of dysautonomia, cyclic vomiting syndrome, cannabis hyperemesis syndrome, kidney disorder, iron -deficiency anemia.  Presents to ED with chief complaint of nausea, vomiting, abdominal pain.  Patient with known history of cannabis hyperemesis syndrome, reports that she has not smoked weed in 1 week.  She states that she has had 1 week of persistent nausea, vomiting.  She denies any diarrhea.  Reports she is abdominal pain when she throws up but no abdominal pain at rest.  Denies any chest pain or shortness of breath.  Denies fevers at home, dysuria.  Denies any other drugs or alcohol.   Emesis Associated symptoms: abdominal pain        Prior to Admission medications  Medication Sig Start Date End Date Taking? Authorizing Provider  amitriptyline  (ELAVIL ) 50 MG tablet TAKE 1 TABLET BY MOUTH EVERYDAY AT BEDTIME 02/19/24   Howell Lunger, DO  ondansetron  (ZOFRAN -ODT) 4 MG disintegrating tablet 4mg  ODT q4 hours prn nausea/vomit 01/10/24   Zammit, Joseph, MD  pantoprazole  (PROTONIX ) 20 MG tablet Take 1 tablet (20 mg total) by mouth daily. 01/10/24   Zammit, Joseph, MD  promethazine -dextromethorphan (PROMETHAZINE -DM) 6.25-15 MG/5ML syrup Take 5 mLs by mouth 2 (two) times daily as needed for cough. 01/07/24   Raspet, Erin K, PA-C  traMADol  (ULTRAM ) 50 MG tablet Take 1 tablet (50 mg total) by mouth every 6 (six) hours as needed. 01/10/24   Suzette Pac, MD    Allergies: Patient has no known allergies.    Review of Systems  Gastrointestinal:  Positive for abdominal pain, nausea and vomiting.  All other systems reviewed and are negative.   Updated Vital Signs BP 122/80   Pulse (!) 118   Temp 99.4 F (37.4 C) (Oral)   Resp 10   Ht 5' 3 (1.6 m)   LMP  (LMP Unknown)   SpO2 100%    BMI 18.08 kg/m   Physical Exam Vitals and nursing note reviewed.  Constitutional:      General: Theresa Burnett is not in acute distress.    Appearance: Theresa Burnett is well-developed.  HENT:     Head: Normocephalic and atraumatic.  Eyes:     Conjunctiva/sclera: Conjunctivae normal.  Cardiovascular:     Rate and Rhythm: Normal rate and regular rhythm.     Heart sounds: No murmur heard. Pulmonary:     Effort: Pulmonary effort is normal. No respiratory distress.     Breath sounds: Normal breath sounds.  Abdominal:     Palpations: Abdomen is soft.     Tenderness: There is abdominal tenderness.  Musculoskeletal:        General: No swelling.     Cervical back: Neck supple.  Skin:    General: Skin is warm and dry.     Capillary Refill: Capillary refill takes less than 2 seconds.  Neurological:     Mental Status: Theresa Burnett is alert and oriented to person, place, and time. Mental status is at baseline.     Comments: Patient follows commands appropriately  Psychiatric:        Mood and Affect: Mood normal.     (all labs ordered are listed, but only abnormal results are displayed) Labs Reviewed  COMPREHENSIVE METABOLIC PANEL WITH GFR - Abnormal; Notable for the following components:      Result  Value   Potassium 2.6 (*)    Chloride 87 (*)    Glucose, Bld 139 (*)    Calcium  10.6 (*)    Total Protein 9.4 (*)    Anion gap 21 (*)    All other components within normal limits  CBC - Abnormal; Notable for the following components:   RBC 5.88 (*)    Hemoglobin 16.0 (*)    HCT 50.0 (*)    RDW 17.6 (*)    Platelets 752 (*)    All other components within normal limits  RESP PANEL BY RT-PCR (RSV, FLU A&B, COVID)  RVPGX2  LIPASE, BLOOD  HCG, SERUM, QUALITATIVE  URINALYSIS, ROUTINE W REFLEX MICROSCOPIC  CBC  BASIC METABOLIC PANEL WITH GFR  MAGNESIUM   URINE DRUG SCREEN    EKG: EKG Interpretation Date/Time:  Wednesday May 21 2024 23:42:59 EST Ventricular Rate:  143 PR  Interval:  112 QRS Duration:  68 QT Interval:  267 QTC Calculation: 412 R Axis:   95  Text Interpretation: Sinus tachycardia LAE, consider biatrial enlargement Borderline right axis deviation Repol abnrm suggests ischemia, diffuse leads Confirmed by Trine Likes [phone]) on 05/21/2024 11:45:58 PM  Radiology: No results found.  Procedures   Medications Ordered in the ED  potassium chloride  10 mEq in 100 mL IVPB (10 mEq Intravenous New Bag/Given 05/22/24 0438)  acetaminophen  (TYLENOL ) tablet 650 mg (has no administration in time range)    Or  acetaminophen  (TYLENOL ) suppository 650 mg (has no administration in time range)  ondansetron  (ZOFRAN ) injection 4 mg (has no administration in time range)  0.9 %  sodium chloride  infusion ( Intravenous New Bag/Given 05/22/24 0439)  pantoprazole  (PROTONIX ) injection 40 mg (40 mg Intravenous Given 05/22/24 0436)  ondansetron  (ZOFRAN ) injection 4 mg (4 mg Intravenous Given 05/21/24 2137)  lactated ringers  bolus 1,000 mL (0 mLs Intravenous Stopped 05/22/24 0229)  droperidol  (INAPSINE ) 2.5 MG/ML injection 1.25 mg (1.25 mg Intravenous Given 05/22/24 0015)  alum & mag hydroxide-simeth (MAALOX/MYLANTA) 200-200-20 MG/5ML suspension 30 mL (30 mLs Oral Given 05/22/24 0111)  lactated ringers  bolus 1,000 mL (0 mLs Intravenous Stopped 05/22/24 0437)     Medical Decision Making Amount and/or Complexity of Data Reviewed Labs: ordered. ECG/medicine tests: ordered.  Risk OTC drugs. Prescription drug management. Decision regarding hospitalization.   This is a 19 year old female presenting to the ED due to concerns of nausea, vomiting and abdominal pain.  On exam she is tachycardic, hypertensive.  Afebrile.  Lung sounds clear bilaterally, no hypoxia.  Abdomen with tenderness throughout.  Neuroexam at baseline, follows commands appropriately.  Patient chart thoroughly reviewed.  Patient has been seen multiple times in the past for intractable nausea and vomiting,  cyclic vomiting, cannabis hyperemesis syndrome.  Suspect this is most likely what is at play here.  The patient reports that she last consumed marijuana 1 week ago prior to symptom onset.  EKG collected which shows no prolonged QT.  Patient given droperidol  at this time with good effect.  Will collect labs to ensure no electrolyte derangement.  CBC without leukocytosis or anemia.  hCG negative.  Lipase 11.  CMP with sodium 139, potassium 2.6 with flattened T waves on EKG, anion gap 21.  Patient requires admission to hospital due to hypokalemia.  Patient has been ordered 10 mEq IV potassium x 5.  Patient was discussed for admission with Dr. Alfornia at 1:34 AM.  Patient advised that she will remain in the hospital due to hypokalemia and she is in agreement with this plan.  Patient stable on admission.    Final diagnoses:  Hypokalemia  Cannabis hyperemesis syndrome    ED Discharge Orders     None          Ruthell Lonni JULIANNA DEVONNA 05/22/24 0504    Trine Raynell Moder, MD 05/22/24 [phone]  "

## 2024-05-22 ENCOUNTER — Observation Stay (HOSPITAL_COMMUNITY)

## 2024-05-22 DIAGNOSIS — R000 Tachycardia, unspecified: Secondary | ICD-10-CM | POA: Diagnosis not present

## 2024-05-22 DIAGNOSIS — R112 Nausea with vomiting, unspecified: Secondary | ICD-10-CM | POA: Diagnosis not present

## 2024-05-22 DIAGNOSIS — R1084 Generalized abdominal pain: Secondary | ICD-10-CM | POA: Diagnosis not present

## 2024-05-22 DIAGNOSIS — K219 Gastro-esophageal reflux disease without esophagitis: Secondary | ICD-10-CM

## 2024-05-22 DIAGNOSIS — E876 Hypokalemia: Principal | ICD-10-CM

## 2024-05-22 LAB — MAGNESIUM: Magnesium: 1.5 mg/dL — ABNORMAL LOW (ref 1.7–2.4)

## 2024-05-22 LAB — CBC
HCT: 40.5 % (ref 36.0–46.0)
Hemoglobin: 12.9 g/dL (ref 12.0–15.0)
MCH: 27.3 pg (ref 26.0–34.0)
MCHC: 31.9 g/dL (ref 30.0–36.0)
MCV: 85.6 fL (ref 80.0–100.0)
Platelets: 509 10*3/uL — ABNORMAL HIGH (ref 150–400)
RBC: 4.73 MIL/uL (ref 3.87–5.11)
RDW: 16.7 % — ABNORMAL HIGH (ref 11.5–15.5)
WBC: 9 10*3/uL (ref 4.0–10.5)
nRBC: 0 % (ref 0.0–0.2)

## 2024-05-22 LAB — URINALYSIS, ROUTINE W REFLEX MICROSCOPIC
Bilirubin Urine: NEGATIVE
Glucose, UA: NEGATIVE mg/dL
Hgb urine dipstick: NEGATIVE
Ketones, ur: 20 mg/dL — AB
Nitrite: NEGATIVE
Protein, ur: 100 mg/dL — AB
Specific Gravity, Urine: 1.02 (ref 1.005–1.030)
pH: 8 (ref 5.0–8.0)

## 2024-05-22 LAB — RESP PANEL BY RT-PCR (RSV, FLU A&B, COVID)  RVPGX2
Influenza A by PCR: NEGATIVE
Influenza B by PCR: NEGATIVE
Resp Syncytial Virus by PCR: NEGATIVE
SARS Coronavirus 2 by RT PCR: NEGATIVE

## 2024-05-22 LAB — BASIC METABOLIC PANEL WITH GFR
Anion gap: 14 (ref 5–15)
BUN: 12 mg/dL (ref 6–20)
CO2: 33 mmol/L — ABNORMAL HIGH (ref 22–32)
Calcium: 9 mg/dL (ref 8.9–10.3)
Chloride: 94 mmol/L — ABNORMAL LOW (ref 98–111)
Creatinine, Ser: 0.66 mg/dL (ref 0.44–1.00)
GFR, Estimated: >60 mL/min
Glucose, Bld: 106 mg/dL — ABNORMAL HIGH (ref 70–99)
Potassium: 3.2 mmol/L — ABNORMAL LOW (ref 3.5–5.1)
Sodium: 141 mmol/L (ref 135–145)

## 2024-05-22 LAB — URINE DRUG SCREEN
Amphetamines: NEGATIVE
Barbiturates: NEGATIVE
Benzodiazepines: NEGATIVE
Cocaine: NEGATIVE
Fentanyl: NEGATIVE
Methadone Scn, Ur: NEGATIVE
Opiates: NEGATIVE
Tetrahydrocannabinol: POSITIVE — AB

## 2024-05-22 MED ORDER — AMITRIPTYLINE HCL 25 MG PO TABS
50.0000 mg | ORAL_TABLET | Freq: Every day | ORAL | Status: DC
  Filled 2024-05-22: qty 2

## 2024-05-22 MED ORDER — PANTOPRAZOLE SODIUM 40 MG IV SOLR
40.0000 mg | INTRAVENOUS | Status: DC
  Administered 2024-05-22: 40 mg via INTRAVENOUS
  Filled 2024-05-22: qty 10

## 2024-05-22 MED ORDER — POTASSIUM CHLORIDE IN NACL 40-0.9 MEQ/L-% IV SOLN
INTRAVENOUS | Status: AC
  Filled 2024-05-22 (×4): qty 1000

## 2024-05-22 MED ORDER — ONDANSETRON HCL 4 MG/2ML IJ SOLN: 4.0000 mg | Freq: Four times a day (QID) | INTRAMUSCULAR | Status: DC | PRN

## 2024-05-22 MED ORDER — LACTATED RINGERS IV BOLUS
1000.0000 mL | Freq: Once | INTRAVENOUS | Status: AC
  Administered 2024-05-22: 1000 mL via INTRAVENOUS

## 2024-05-22 MED ORDER — ACETAMINOPHEN 325 MG PO TABS: 650.0000 mg | ORAL_TABLET | Freq: Four times a day (QID) | ORAL | Status: DC | PRN

## 2024-05-22 MED ORDER — POTASSIUM CHLORIDE 10 MEQ/100ML IV SOLN
10.0000 meq | INTRAVENOUS | Status: AC
  Administered 2024-05-22 (×4): 10 meq via INTRAVENOUS
  Filled 2024-05-22 (×4): qty 100

## 2024-05-22 MED ORDER — ACETAMINOPHEN 650 MG RE SUPP: 650.0000 mg | Freq: Four times a day (QID) | RECTAL | Status: DC | PRN

## 2024-05-22 MED ORDER — ONDANSETRON 4 MG PO TBDP
4.0000 mg | ORAL_TABLET | Freq: Three times a day (TID) | ORAL | Status: AC | PRN
  Administered 2024-05-23: 4 mg via ORAL
  Filled 2024-05-22: qty 1

## 2024-05-22 MED ORDER — MAGNESIUM SULFATE 4 GM/100ML IV SOLN
4.0000 g | Freq: Once | INTRAVENOUS | Status: AC
  Administered 2024-05-22: 4 g via INTRAVENOUS
  Filled 2024-05-22: qty 100

## 2024-05-22 MED ORDER — ALUM & MAG HYDROXIDE-SIMETH 200-200-20 MG/5ML PO SUSP
30.0000 mL | Freq: Once | ORAL | Status: AC
  Administered 2024-05-22: 30 mL via ORAL
  Filled 2024-05-22: qty 30

## 2024-05-22 MED ORDER — SODIUM CHLORIDE 0.9 % IV SOLN: INTRAVENOUS | Status: DC

## 2024-05-22 NOTE — ED Notes (Signed)
 MD updated diet to NPO due to pt vomiting. Pt notified of NPO diet. Pt very upset. This RN removed clear liquid tray & fluids from bedside table

## 2024-05-22 NOTE — Progress Notes (Signed)
 " PROGRESS NOTE    Theresa Burnett  FMW:[id number] DOB: Apr 21, 2006 DOA: 05/21/2024 PCP: Howell Lunger, DO    Chief Complaint  Patient presents with   Emesis    Brief Narrative:  Patient is a 19 year old female history of dysautonomia, cannabinoid hyperemesis syndrome, GERD, H. pylori infection, s/p cholecystectomy, tobacco abuse presented to the ED with complaints of nausea vomiting and abdominal pain x 1 week.  Patient noted to be tachycardic on presentation.  Lab work concerning for significant hypokalemia with a potassium of 2.6, thrombocytosis with a platelet count of 752, urinalysis pending, hCG negative.  Patient placed on IV potassium, placed on IV antiemetics and supportive care.   Assessment & Plan:   Principal Problem:   Intractable nausea and vomiting Active Problems:   Hypokalemia   GERD (gastroesophageal reflux disease)   Generalized abdominal pain   Sinus tachycardia  #1 intractable nausea vomiting/generalized abdominal pain/GERD -Suspect patient's intractable nausea and vomiting abdominal pain likely secondary to cannabinoid hyperemesis syndrome in the setting of possible constipation. - Patient with prior history of cholecystectomy, abdominal exam unremarkable. -Lipase level unremarkable, normal LFTs, serum hCG negative. -Urinalysis is hazy, trace leukocytes, nitrite negative, 100 protein, rare bacteria, 6-10 WBCs.  Patient with normal white count. - Patient with some clinical improvement as patient asking for food and liquids. - Check abdominal films. - Trial of clear liquids. - Continue IV fluids, IV antiemetics,, IV PPI, supportive care.  2.  Hypokalemia/hypomagnesemia -Likely secondary to GI losses from nausea and vomiting. - Patient on admission noted to have a potassium of 2.6 and currently at 3.2 after 5 runs of KCl 10 mEq. - KCl 10 mEq IV every hour x 5 rounds. - Please potassium and IV fluids. - Magnesium  sulfate 4 g IV x 1. - Repeat labs in the  AM.  3.  Dehydration -IV fluids.  4.  Mild hypercalcemia -Likely secondary to dehydration. - Improved with IV fluids.  5.  Erythrocytosis and thrombocytosis -Secondary to hemoconcentration from dehydration. - Improving with hydration.  6.  Sinus tachycardia -Secondary to dehydration. - Improving with IV fluids.   DVT prophylaxis: SCDs Code Status: Full Family Communication: Updated patient and mother at bedside. Disposition: Likely home when clinically improved and tolerating oral intake.  Hopefully in the next 24 to 48 hours.  Status is: Observation The patient remains OBS appropriate and will d/c before 2 midnights.   Consultants:  None  Procedures:  None  Antimicrobials:  Anti-infectives (From admission, onward)    None         Subjective: Patient laying on gurney, mother laying beside her.  Patient asking for some ginger ale.  Patient states last bout of emesis was last night.  Patient stated had not had a bowel movement in a week however had a bowel movement yesterday.  Patient denies any hematemesis.  Denies any abdominal pain this morning.  Patient asking for food.  Mother at bedside.  Objective: Vitals:   05/22/24 0500 05/22/24 0530 05/22/24 0645 05/22/24 0718  BP: 125/77 108/74  109/67  Pulse: (!) 112  (!) 117 (!) 101  Resp: 19 18 17 19   Temp:    99.4 F (37.4 C)  TempSrc:    Oral  SpO2: 100%  100% 98%  Height:        Intake/Output Summary (Last 24 hours) at 05/22/2024 1022 Last data filed at 05/21/2024 2327 Gross per 24 hour  Intake --  Output 400 ml  Net -400 ml   There were  no vitals filed for this visit.  Examination:  General exam: Appears calm and comfortable  Respiratory system: Clear to auscultation. Respiratory effort normal. Cardiovascular system: Tachycardia. No JVD, murmurs, rubs, gallops or clicks. No pedal edema. Gastrointestinal system: Abdomen is nondistended, soft and nontender. No organomegaly or masses felt. Normal bowel  sounds heard. Central nervous system: Alert and oriented. No focal neurological deficits. Extremities: Symmetric 5 x 5 power. Skin: No rashes, lesions or ulcers Psychiatry: Judgement and insight appear normal. Mood & affect appropriate.     Data Reviewed: I have personally reviewed following labs and imaging studies  CBC: Recent Labs  Lab 05/21/24 2132 05/22/24 0427  WBC 8.6 9.0  HGB 16.0* 12.9  HCT 50.0* 40.5  MCV 85.0 85.6  PLT 752* 509*    Basic Metabolic Panel: Recent Labs  Lab 05/21/24 2132 05/22/24 0427  NA 139 141  K 2.6* 3.2*  CL 87* 94*  CO2 30 33*  GLUCOSE 139* 106*  BUN 16 12  CREATININE 0.66 0.66  CALCIUM  10.6* 9.0  MG  --  1.5*    GFR: CrCl cannot be calculated (Unknown ideal weight.).  Liver Function Tests: Recent Labs  Lab 05/21/24 2132  AST 22  ALT 7  ALKPHOS 96  BILITOT 0.7  PROT 9.4*  ALBUMIN 5.0    CBG: No results for input(s): GLUCAP in the last 168 hours.   Recent Results (from the past 240 hours)  Resp panel by RT-PCR (RSV, Flu A&B, Covid) Anterior Nasal Swab     Status: None   Collection Time: 05/22/24  6:03 AM   Specimen: Anterior Nasal Swab  Result Value Ref Range Status   SARS Coronavirus 2 by RT PCR NEGATIVE NEGATIVE Final    Comment: (NOTE) SARS-CoV-2 target nucleic acids are NOT DETECTED.  The SARS-CoV-2 RNA is generally detectable in upper respiratory specimens during the acute phase of infection. The lowest concentration of SARS-CoV-2 viral copies this assay can detect is 138 copies/mL. A negative result does not preclude SARS-Cov-2 infection and should not be used as the sole basis for treatment or other patient management decisions. A negative result may occur with  improper specimen collection/handling, submission of specimen other than nasopharyngeal swab, presence of viral mutation(s) within the areas targeted by this assay, and inadequate number of viral copies(<138 copies/mL). A negative result must be  combined with clinical observations, patient history, and epidemiological information. The expected result is Negative.  Fact Sheet for Patients:  bloggercourse.com  Fact Sheet for Healthcare Providers:  seriousbroker.it  This test is no t yet approved or cleared by the [place]  FDA and  has been authorized for detection and/or diagnosis of SARS-CoV-2 by FDA under an Emergency Use Authorization (EUA). This EUA will remain  in effect (meaning this test can be used) for the duration of the COVID-19 declaration under Section 564(b)(1) of the Act, 21 U.S.C.section 360bbb-3(b)(1), unless the authorization is terminated  or revoked sooner.       Influenza A by PCR NEGATIVE NEGATIVE Final   Influenza B by PCR NEGATIVE NEGATIVE Final    Comment: (NOTE) The Xpert Xpress SARS-CoV-2/FLU/RSV plus assay is intended as an aid in the diagnosis of influenza from Nasopharyngeal swab specimens and should not be used as a sole basis for treatment. Nasal washings and aspirates are unacceptable for Xpert Xpress SARS-CoV-2/FLU/RSV testing.  Fact Sheet for Patients: bloggercourse.com  Fact Sheet for Healthcare Providers: seriousbroker.it  This test is not yet approved or cleared by the [place]  FDA  and has been authorized for detection and/or diagnosis of SARS-CoV-2 by FDA under an Emergency Use Authorization (EUA). This EUA will remain in effect (meaning this test can be used) for the duration of the COVID-19 declaration under Section 564(b)(1) of the Act, 21 U.S.C. section 360bbb-3(b)(1), unless the authorization is terminated or revoked.     Resp Syncytial Virus by PCR NEGATIVE NEGATIVE Final    Comment: (NOTE) Fact Sheet for Patients: bloggercourse.com  Fact Sheet for Healthcare Providers: seriousbroker.it  This test is not yet  approved or cleared by the [place]  FDA and has been authorized for detection and/or diagnosis of SARS-CoV-2 by FDA under an Emergency Use Authorization (EUA). This EUA will remain in effect (meaning this test can be used) for the duration of the COVID-19 declaration under Section 564(b)(1) of the Act, 21 U.S.C. section 360bbb-3(b)(1), unless the authorization is terminated or revoked.  Performed at [facility], [address], [place], [place] [address]          Radiology Studies: No results found.      Scheduled Meds:  amitriptyline   50 mg Oral QHS   pantoprazole  (PROTONIX ) IV  40 mg Intravenous Q24H   Continuous Infusions:  0.9 % NaCl with KCl 40 mEq / L 125 mL/hr at 05/22/24 1020   magnesium  sulfate bolus IVPB     potassium chloride  10 mEq (05/22/24 1021)     LOS: 0 days    Time spent: 40 minutes    Toribio Hummer, MD [facility]   To contact the attending provider between 7A-7P or the covering provider during after hours 7P-7A, please log into the web site [email or web address] and access using universal [place] password for that web site. If you do not have the password, please call the hospital operator.  05/22/2024, 10:22 AM    "

## 2024-05-22 NOTE — ED Notes (Addendum)
 Pt requested food/drink, pt diet is currently NPO. Pt. Stated she has been eating/drinking. Messaged attending MD to verify. No new orders at this time.

## 2024-05-22 NOTE — ED Notes (Addendum)
 Per attending MD, pt could have clear liquids. Pt had water & broth. Pt is actively vomiting. MD notified via secure chat

## 2024-05-22 NOTE — Progress Notes (Signed)
 Attempted PIV access x 2 with no success. Vessels very small and blowing. R arm not assessed at this time per pt request. RN made aware.

## 2024-05-22 NOTE — H&P (Signed)
 " History and Physical    Theresa Burnett FMW:[id number] DOB: 2005-07-16 DOA: 05/21/2024  PCP: Howell Lunger, DO  Patient coming from: Home  Chief Complaint: Vomiting  HPI: Theresa Burnett is a 19 y.o. female with medical history significant of dysautonomia, cannabinoid hyperemesis syndrome, GERD, H. pylori infection, cholecystectomy, tobacco abuse presented to the ED with complaints of nausea, vomiting, and abdominal pain.  Tachycardic up to 140s but afebrile.  Not hypotensive.  Labs showing no leukocytosis, hemoglobin 16.0, platelet count 752k, potassium 2.6, chloride 87, creatinine 0.6, calcium  10.6, albumin 5.0, normal lipase and LFTs, UA pending, serum hCG negative.  Patient was given Maalox, droperidol , Zofran , 2 L LR, and IV potassium.  TRH called to admit.  Patient is reporting 1 week history of nausea, vomiting, acid reflux, and generalized abdominal pain.  She has not been able to tolerate any p.o. intake.  States she has not used marijuana this past week.  Denies fevers, cough, shortness of breath, or chest pain.  Denies any urinary symptoms.  Review of Systems:  Review of Systems  All other systems reviewed and are negative.   Past Medical History:  Diagnosis Date   H. pylori infection     Past Surgical History:  Procedure Laterality Date   CHOLECYSTECTOMY       reports that Ajai has quit smoking. Mardee's smoking use included cigarettes and e-cigarettes. Kasiya has been exposed to tobacco smoke. Sharlene has never used smokeless tobacco. Charlsie reports current alcohol use. Vienne reports current drug use. Drug: Marijuana.  Allergies[1]  Family History  Problem Relation Age of Onset   Kidney disease Maternal Grandmother    COPD Maternal Grandmother    Hypertension Maternal Grandmother    Diabetes Maternal Grandmother     Prior to Admission medications  Medication Sig Start Date End Date Taking? Authorizing Provider  amitriptyline  (ELAVIL ) 50 MG tablet  TAKE 1 TABLET BY MOUTH EVERYDAY AT BEDTIME 02/19/24   Howell Lunger, DO  ondansetron  (ZOFRAN -ODT) 4 MG disintegrating tablet 4mg  ODT q4 hours prn nausea/vomit 01/10/24   Zammit, Joseph, MD  pantoprazole  (PROTONIX ) 20 MG tablet Take 1 tablet (20 mg total) by mouth daily. 01/10/24   Zammit, Joseph, MD  promethazine -dextromethorphan (PROMETHAZINE -DM) 6.25-15 MG/5ML syrup Take 5 mLs by mouth 2 (two) times daily as needed for cough. 01/07/24   Raspet, Erin K, PA-C  traMADol  (ULTRAM ) 50 MG tablet Take 1 tablet (50 mg total) by mouth every 6 (six) hours as needed. 01/10/24   Suzette Pac, MD    Physical Exam: Vitals:   05/21/24 2317 05/21/24 2352 05/22/24 0244 05/22/24 0245  BP: (!) 144/108 (!) 148/128 122/80 122/80  Pulse: (!) 143 (!) 135 (!) 119 (!) 118  Resp: (!) 24 (!) 23 12 10   Temp: (!) 97.4 F (36.3 C)  99.4 F (37.4 C)   TempSrc: Oral  Oral   SpO2: 100% 100% 100% 100%  Height:        Physical Exam Vitals reviewed.  Constitutional:      General: Theresa Burnett is not in acute distress. HENT:     Head: Normocephalic and atraumatic.  Eyes:     Extraocular Movements: Extraocular movements intact.  Cardiovascular:     Rate and Rhythm: Regular rhythm. Tachycardia present.  Pulmonary:     Effort: Pulmonary effort is normal. No respiratory distress.     Breath sounds: Normal breath sounds.  Abdominal:     General: Bowel sounds are normal. There is no distension.     Palpations: Abdomen  is soft.     Tenderness: There is no abdominal tenderness. There is no guarding or rebound.  Musculoskeletal:     Cervical back: Normal range of motion.     Right lower leg: No edema.     Left lower leg: No edema.  Skin:    General: Skin is warm and dry.  Neurological:     General: No focal deficit present.     Mental Status: Theresa Burnett is alert and oriented to person, place, and time.     Labs on Admission: I have personally reviewed following labs and imaging studies  CBC: Recent Labs  Lab  05/21/24 2132  WBC 8.6  HGB 16.0*  HCT 50.0*  MCV 85.0  PLT 752*   Basic Metabolic Panel: Recent Labs  Lab 05/21/24 2132  NA 139  K 2.6*  CL 87*  CO2 30  GLUCOSE 139*  BUN 16  CREATININE 0.66  CALCIUM  10.6*   GFR: CrCl cannot be calculated (Unknown ideal weight.). Liver Function Tests: Recent Labs  Lab 05/21/24 2132  AST 22  ALT 7  ALKPHOS 96  BILITOT 0.7  PROT 9.4*  ALBUMIN 5.0   Recent Labs  Lab 05/21/24 2132  LIPASE 11   No results for input(s): AMMONIA in the last 168 hours. Coagulation Profile: No results for input(s): INR, PROTIME in the last 168 hours. Cardiac Enzymes: No results for input(s): CKTOTAL, CKMB, CKMBINDEX, TROPONINI in the last 168 hours. BNP (last 3 results) No results for input(s): PROBNP in the last 8760 hours. HbA1C: No results for input(s): HGBA1C in the last 72 hours. CBG: No results for input(s): GLUCAP in the last 168 hours. Lipid Profile: No results for input(s): CHOL, HDL, LDLCALC, TRIG, CHOLHDL, LDLDIRECT in the last 72 hours. Thyroid Function Tests: No results for input(s): TSH, T4TOTAL, FREET4, T3FREE, THYROIDAB in the last 72 hours. Anemia Panel: No results for input(s): VITAMINB12, FOLATE, FERRITIN, TIBC, IRON , RETICCTPCT in the last 72 hours. Urine analysis:    Component Value Date/Time   COLORURINE YELLOW 01/10/2024 1220   APPEARANCEUR HAZY (A) 01/10/2024 1220   LABSPEC 1.033 (H) 01/10/2024 1220   PHURINE 6.0 01/10/2024 1220   GLUCOSEU NEGATIVE 01/10/2024 1220   HGBUR NEGATIVE 01/10/2024 1220   BILIRUBINUR NEGATIVE 01/10/2024 1220   KETONESUR 20 (A) 01/10/2024 1220   PROTEINUR 100 (A) 01/10/2024 1220   UROBILINOGEN 0.2 03/22/2007 1245   NITRITE NEGATIVE 01/10/2024 1220   LEUKOCYTESUR NEGATIVE 01/10/2024 1220    Radiological Exams on Admission: No results found.  EKG: Independently reviewed.  Sinus tachycardia.  Assessment and Plan  Intractable  nausea and vomiting Generalized abdominal pain GERD Suspect vomiting and abdominal pain are due to ongoing marijuana abuse given history.  UDS ordered to confirm.  History of prior cholecystectomy and abdominal exam benign.  No fever or leukocytosis.  Normal lipase and LFTs.  Serum hCG negative.  Continue IV fluid hydration and antiemetic PRN.  IV Protonix  40 mg daily.  Check for COVID and flu.  Follow-up UA.  Hypokalemia In setting of poor p.o. intake.  Monitor potassium and magnesium  levels, continue to replace as needed.  Mild hypercalcemia Likely due to dehydration.  Continue IV fluid hydration and monitor labs.  Erythrocytosis and thrombocytosis Suspect hemoconcentration from dehydration.  Repeat CBC ordered to confirm.  Sinus tachycardia Likely due to dehydration, now improving after IV fluids.  DVT prophylaxis: SCDs Code Status: Full Code (discussed with the patient) Level of care: Progressive Care Unit Admission status: It is my clinical opinion  that referral for OBSERVATION is reasonable and necessary in this patient based on the above information provided. The aforementioned taken together are felt to place the patient at high risk for further clinical deterioration. However, it is anticipated that the patient may be medically stable for discharge from the hospital within 24 to 48 hours.  Editha Ram MD [facility]  If 7PM-7AM, please contact night-coverage [email or web address]  05/22/2024, 3:16 AM        [1] No Known Allergies  "

## 2024-05-22 NOTE — ED Notes (Signed)
 IV attempted by RN x 2 with no success

## 2024-05-22 NOTE — ED Notes (Addendum)
 Pt called out to request sandwich, this RN explained to pt and mother about NPO diet. Mother got upset, stated pt has been getting food. This RN explained to both pt & mom that  MD would get notified to verify about diet but couldn't get anything at this time.

## 2024-05-23 DIAGNOSIS — R000 Tachycardia, unspecified: Secondary | ICD-10-CM | POA: Diagnosis not present

## 2024-05-23 DIAGNOSIS — E876 Hypokalemia: Secondary | ICD-10-CM | POA: Diagnosis not present

## 2024-05-23 DIAGNOSIS — R112 Nausea with vomiting, unspecified: Secondary | ICD-10-CM | POA: Diagnosis not present

## 2024-05-23 DIAGNOSIS — K219 Gastro-esophageal reflux disease without esophagitis: Secondary | ICD-10-CM | POA: Diagnosis not present

## 2024-05-23 LAB — BASIC METABOLIC PANEL WITH GFR
Anion gap: 10 (ref 5–15)
BUN: <5 mg/dL — ABNORMAL LOW (ref 6–20)
CO2: 28 mmol/L (ref 22–32)
Calcium: 9 mg/dL (ref 8.9–10.3)
Chloride: 98 mmol/L (ref 98–111)
Creatinine, Ser: 0.57 mg/dL (ref 0.44–1.00)
GFR, Estimated: >60 mL/min
Glucose, Bld: 100 mg/dL — ABNORMAL HIGH (ref 70–99)
Potassium: 3.5 mmol/L (ref 3.5–5.1)
Sodium: 136 mmol/L (ref 135–145)

## 2024-05-23 LAB — CBC WITH DIFFERENTIAL/PLATELET
Abs Immature Granulocytes: 0.02 10*3/uL (ref 0.00–0.07)
Basophils Absolute: 0 10*3/uL (ref 0.0–0.1)
Basophils Relative: 1 %
Eosinophils Absolute: 0.2 10*3/uL (ref 0.0–0.5)
Eosinophils Relative: 2 %
HCT: 40.3 % (ref 36.0–46.0)
Hemoglobin: 13.3 g/dL (ref 12.0–15.0)
Immature Granulocytes: 0 %
Lymphocytes Relative: 36 %
Lymphs Abs: 2.7 10*3/uL (ref 0.7–4.0)
MCH: 27.8 pg (ref 26.0–34.0)
MCHC: 33 g/dL (ref 30.0–36.0)
MCV: 84.1 fL (ref 80.0–100.0)
Monocytes Absolute: 0.6 10*3/uL (ref 0.1–1.0)
Monocytes Relative: 8 %
Neutro Abs: 3.9 10*3/uL (ref 1.7–7.7)
Neutrophils Relative %: 53 %
Platelets: 493 10*3/uL — ABNORMAL HIGH (ref 150–400)
RBC: 4.79 MIL/uL (ref 3.87–5.11)
RDW: 16.9 % — ABNORMAL HIGH (ref 11.5–15.5)
WBC: 7.3 10*3/uL (ref 4.0–10.5)
nRBC: 0 % (ref 0.0–0.2)

## 2024-05-23 LAB — MAGNESIUM: Magnesium: 2 mg/dL (ref 1.7–2.4)

## 2024-05-23 MED ORDER — PANTOPRAZOLE SODIUM 40 MG IV SOLR: 40.0000 mg | INTRAVENOUS | Status: DC

## 2024-05-23 MED ORDER — BISACODYL 10 MG RE SUPP: 10.0000 mg | Freq: Once | RECTAL | Status: DC

## 2024-05-23 MED ORDER — TRAMADOL HCL 50 MG PO TABS: 50.0000 mg | ORAL_TABLET | Freq: Four times a day (QID) | ORAL | Status: DC | PRN

## 2024-05-23 MED ORDER — KETOROLAC TROMETHAMINE 30 MG/ML IJ SOLN: 15.0000 mg | Freq: Four times a day (QID) | INTRAMUSCULAR | Status: DC | PRN

## 2024-05-23 MED ORDER — POTASSIUM CHLORIDE CRYS ER 10 MEQ PO TBCR
40.0000 meq | EXTENDED_RELEASE_TABLET | Freq: Once | ORAL | Status: DC
  Filled 2024-05-23: qty 4

## 2024-05-23 MED ORDER — BOOST / RESOURCE BREEZE PO LIQD CUSTOM: 1.0000 | Freq: Three times a day (TID) | ORAL | Status: DC

## 2024-05-23 MED ORDER — POTASSIUM CHLORIDE 20 MEQ PO PACK
40.0000 meq | PACK | Freq: Once | ORAL | Status: AC
  Administered 2024-05-23: 40 meq via ORAL
  Filled 2024-05-23: qty 2

## 2024-05-23 MED ORDER — PANTOPRAZOLE SODIUM 40 MG PO TBEC
40.0000 mg | DELAYED_RELEASE_TABLET | Freq: Every day | ORAL | Status: DC
  Filled 2024-05-23: qty 1

## 2024-05-23 MED ORDER — KETOROLAC TROMETHAMINE 30 MG/ML IJ SOLN
15.0000 mg | Freq: Four times a day (QID) | INTRAMUSCULAR | Status: DC | PRN
  Administered 2024-05-23: 30 mg via INTRAMUSCULAR
  Filled 2024-05-23: qty 1

## 2024-05-23 NOTE — Discharge Summary (Signed)
 Physician Discharge Summary  [name] FMW:[id number] DOB: 2005-11-04 DOA: 05/21/2024  PCP: Howell Lunger, DO  Admit date: 05/21/2024 Discharge date: 05/23/2024     Patient left AMA Time spent: 25 minutes  Recommendations for Outpatient Follow-up:  Patient left AMA   Discharge Diagnoses:  Principal Problem:   Intractable nausea and vomiting Active Problems:   Hypokalemia   GERD (gastroesophageal reflux disease)   Generalized abdominal pain   Sinus tachycardia   Discharge Condition: Left AMA  Diet recommendation: Left AMA  There were no vitals filed for this visit.  History of present illness:  HPI per Dr. Alfornia Theresa Burnett is a 19 y.o. female with medical history significant of dysautonomia, cannabinoid hyperemesis syndrome, GERD, H. pylori infection, cholecystectomy, tobacco abuse presented to the ED with complaints of nausea, vomiting, and abdominal pain.  Tachycardic up to 140s but afebrile.  Not hypotensive.  Labs showing no leukocytosis, hemoglobin 16.0, platelet count 752k, potassium 2.6, chloride 87, creatinine 0.6, calcium  10.6, albumin 5.0, normal lipase and LFTs, UA pending, serum hCG negative.  Patient was given Maalox, droperidol , Zofran , 2 L LR, and IV potassium.  TRH called to admit.   Patient is reporting 1 week history of nausea, vomiting, acid reflux, and generalized abdominal pain.  She has not been able to tolerate any p.o. intake.  States she has not used marijuana this past week.  Denies fevers, cough, shortness of breath, or chest pain.  Denies any urinary symptoms.  Hospital Course:      Patient left AMA -For hospital course please refer to progress note from 05/23/2024.  Procedures: Abdominal films 05/22/2024  Consultations: None  Discharge Exam: Vitals:   05/23/24 0419 05/23/24 1320  BP: 104/77 (!) 149/101  Pulse: (!) 108 86  Resp: 16 18  Temp: 98.4 F (36.9 C) 98.1 F (36.7 C)  SpO2: 90% 100%    General:  NAD Cardiovascular: RRR no murmurs rubs or gallops.  No JVD.  No lower extremity edema. Respiratory: Clear to auscultation bilaterally.  No wheezes, no crackles, no rhonchi.  Discharge Instructions  Patient left AMA   Allergies[1]    The results of significant diagnostics from this hospitalization (including imaging, microbiology, ancillary and laboratory) are listed below for reference.    Significant Diagnostic Studies: DG Abd 2 Views Result Date: 05/22/2024 EXAM: 2 VIEW XRAY OF THE ABDOMEN 05/22/2024 11:00:00 AM COMPARISON: None available. CLINICAL HISTORY: Nausea/vomiting. FINDINGS: LINES, TUBES AND DEVICES: Multiple overlying monitor wires over abdomen and pelvis. BOWEL: Nonobstructive bowel gas pattern. No signs of free air. SOFT TISSUES: No abnormal calcifications. BONES: No acute fracture. IMPRESSION: 1. No acute findings. Electronically signed by: Waddell Calk MD 05/22/2024 11:45 AM EST RP Workstation: [id number]    Microbiology: Recent Results (from the past 240 hours)  Resp panel by RT-PCR (RSV, Flu A&B, Covid) Anterior Nasal Swab     Status: None   Collection Time: 05/22/24  6:03 AM   Specimen: Anterior Nasal Swab  Result Value Ref Range Status   SARS Coronavirus 2 by RT PCR NEGATIVE NEGATIVE Final    Comment: (NOTE) SARS-CoV-2 target nucleic acids are NOT DETECTED.  The SARS-CoV-2 RNA is generally detectable in upper respiratory specimens during the acute phase of infection. The lowest concentration of SARS-CoV-2 viral copies this assay can detect is 138 copies/mL. A negative result does not preclude SARS-Cov-2 infection and should not be used as the sole basis for treatment or other patient management decisions. A negative result may occur with  improper specimen  collection/handling, submission of specimen other than nasopharyngeal swab, presence of viral mutation(s) within the areas targeted by this assay, and inadequate number of viral copies(<138 copies/mL). A  negative result must be combined with clinical observations, patient history, and epidemiological information. The expected result is Negative.  Fact Sheet for Patients:  bloggercourse.com  Fact Sheet for Healthcare Providers:  seriousbroker.it  This test is no t yet approved or cleared by the [place]  FDA and  has been authorized for detection and/or diagnosis of SARS-CoV-2 by FDA under an Emergency Use Authorization (EUA). This EUA will remain  in effect (meaning this test can be used) for the duration of the COVID-19 declaration under Section 564(b)(1) of the Act, 21 U.S.C.section 360bbb-3(b)(1), unless the authorization is terminated  or revoked sooner.       Influenza A by PCR NEGATIVE NEGATIVE Final   Influenza B by PCR NEGATIVE NEGATIVE Final    Comment: (NOTE) The Xpert Xpress SARS-CoV-2/FLU/RSV plus assay is intended as an aid in the diagnosis of influenza from Nasopharyngeal swab specimens and should not be used as a sole basis for treatment. Nasal washings and aspirates are unacceptable for Xpert Xpress SARS-CoV-2/FLU/RSV testing.  Fact Sheet for Patients: bloggercourse.com  Fact Sheet for Healthcare Providers: seriousbroker.it  This test is not yet approved or cleared by the [place]  FDA and has been authorized for detection and/or diagnosis of SARS-CoV-2 by FDA under an Emergency Use Authorization (EUA). This EUA will remain in effect (meaning this test can be used) for the duration of the COVID-19 declaration under Section 564(b)(1) of the Act, 21 U.S.C. section 360bbb-3(b)(1), unless the authorization is terminated or revoked.     Resp Syncytial Virus by PCR NEGATIVE NEGATIVE Final    Comment: (NOTE) Fact Sheet for Patients: bloggercourse.com  Fact Sheet for Healthcare  Providers: seriousbroker.it  This test is not yet approved or cleared by the [place]  FDA and has been authorized for detection and/or diagnosis of SARS-CoV-2 by FDA under an Emergency Use Authorization (EUA). This EUA will remain in effect (meaning this test can be used) for the duration of the COVID-19 declaration under Section 564(b)(1) of the Act, 21 U.S.C. section 360bbb-3(b)(1), unless the authorization is terminated or revoked.  Performed at [facility], [address] [place], [place] [address]      Labs: Basic Metabolic Panel: Recent Labs  Lab 05/21/24 2132 05/22/24 0427 05/23/24 0815  NA 139 141 136  K 2.6* 3.2* 3.5  CL 87* 94* 98  CO2 30 33* 28  GLUCOSE 139* 106* 100*  BUN 16 12 <5*  CREATININE 0.66 0.66 0.57  CALCIUM  10.6* 9.0 9.0  MG  --  1.5* 2.0   Liver Function Tests: Recent Labs  Lab 05/21/24 2132  AST 22  ALT 7  ALKPHOS 96  BILITOT 0.7  PROT 9.4*  ALBUMIN 5.0   Recent Labs  Lab 05/21/24 2132  LIPASE 11   No results for input(s): AMMONIA in the last 168 hours. CBC: Recent Labs  Lab 05/21/24 2132 05/22/24 0427 05/23/24 0815  WBC 8.6 9.0 7.3  NEUTROABS  --   --  3.9  HGB 16.0* 12.9 13.3  HCT 50.0* 40.5 40.3  MCV 85.0 85.6 84.1  PLT 752* 509* 493*   Cardiac Enzymes: No results for input(s): CKTOTAL, CKMB, CKMBINDEX, TROPONINI in the last 168 hours. BNP: BNP (last 3 results) No results for input(s): BNP in the last 8760 hours.  ProBNP (last 3 results) No results for input(s): PROBNP  in the last 8760 hours.  CBG: No results for input(s): GLUCAP in the last 168 hours.     Signed:  Toribio Hummer MD.  [facility] 05/23/2024, 4:54 PM        [1] No Known Allergies

## 2024-05-23 NOTE — Progress Notes (Signed)
 Patient stated she wanted to be allowed to go outside or she would leave AMA. Patient educated on hospital policy that patients must have a doctors order to leave the unit. Patient verbalized understanding and stated she did not need anything else at this time.   Around 10 minutes later patient ambulated independently to the nurses station requesting to leave AMA. Patient educated on risks of leaving against medical advice and this RN advised for patient to wait for Sebastian, MD to respond to the notification. Patient stated she did not want to wait. Tele box removed, patient to leave after collecting her belongings.

## 2024-05-23 NOTE — Progress Notes (Signed)
 " PROGRESS NOTE    Theresa Burnett  FMW:[id number] DOB: April 26, 2005 DOA: 05/21/2024 PCP: Howell Lunger, DO    Chief Complaint  Patient presents with   Emesis    Brief Narrative:  Patient is a 19 year old female history of dysautonomia, cannabinoid hyperemesis syndrome, GERD, H. pylori infection, s/p cholecystectomy, tobacco abuse presented to the ED with complaints of nausea vomiting and abdominal pain x 1 week.  Patient noted to be tachycardic on presentation.  Lab work concerning for significant hypokalemia with a potassium of 2.6, thrombocytosis with a platelet count of 752, urinalysis pending, hCG negative.  Patient placed on IV potassium, placed on IV antiemetics and supportive care.   Assessment & Plan:   Principal Problem:   Intractable nausea and vomiting Active Problems:   Hypokalemia   GERD (gastroesophageal reflux disease)   Generalized abdominal pain   Sinus tachycardia  #1 intractable nausea vomiting/generalized abdominal pain/GERD -Suspect patient's intractable nausea and vomiting abdominal pain likely secondary to cannabinoid hyperemesis syndrome in the setting of possible constipation. - Patient with prior history of cholecystectomy, abdominal exam unremarkable. -Lipase level unremarkable, normal LFTs, serum hCG negative. -Urinalysis is hazy, trace leukocytes, nitrite negative, 100 protein, rare bacteria, 6-10 WBCs.  Patient with normal white count. - Patient with some clinical improvement. - Patient tried on clear liquids yesterday however had recurrent nausea and emesis and as such had to be placed back on bowel rest.  -Trial of clear liquids yesterday evening which per RN patient may have tolerated and as such we will advance to a full liquid diet.   - Abdominal films unremarkable.   - Continue IV fluids, IV antiemetics, IV PPI, pain management, supportive care.    2.  Hypokalemia/hypomagnesemia -Likely secondary to GI losses from nausea and vomiting. -  Patient on admission noted to have a potassium of 2.6 and up to 3.5 today.   - Patient noted to have received IV potassium runs as well as IV being placed in the fluids.   - KCl 20 mEq p.o. x 1.   - Magnesium  repleted and currently at 2.0 from 1.5.   - Repeat labs in the AM.   3.  Dehydration -IV fluids. - Patient noted with difficulty getting IV placed and oral intake encouraged.  4.  Mild hypercalcemia -Likely secondary to dehydration. - Resolved with hydration.  5.  Erythrocytosis and thrombocytosis -Secondary to hemoconcentration from dehydration. - Improved with hydration  6.  Sinus tachycardia -Secondary to dehydration. - Improved with hydration.    DVT prophylaxis: SCDs Code Status: Full Family Communication: Updated patient and mother at bedside. Disposition: Likely home when clinically improved and tolerating oral intake with resolution of electrolyte abnormalities, hopefully in the next 24 hours..  Status is: Inpatient    Consultants:  None  Procedures:  None  Antimicrobials:  Anti-infectives (From admission, onward)    None         Subjective: Patient laying in bed.  States some improvement with nausea and emesis.  Still with complaints of diffuse abdominal pain.  Patient with complaints that she is having her menstrual cycle and that may be contributing to her abdominal pain and nausea and vomiting.  Tolerating clear liquids.  Patient has not tried soft diet.  Objective: Vitals:   05/22/24 1309 05/22/24 1417 05/22/24 2230 05/23/24 0419  BP: (!) 139/98 (!) 118/90 119/76 104/77  Pulse: (!) 121 (!) 111 95 (!) 108  Resp: 18 16 16 16   Temp: 98.9 F (37.2 C) 98.1 F (36.7  C) 98 F (36.7 C) 98.4 F (36.9 C)  TempSrc: Axillary Oral Oral Oral  SpO2: 100% 100% 100% 90%  Height:        Intake/Output Summary (Last 24 hours) at 05/23/2024 1304 Last data filed at 05/22/2024 1600 Gross per 24 hour  Intake 1020.37 ml  Output --  Net 1020.37 ml   There  were no vitals filed for this visit.  Examination:  General exam: Appears calm and comfortable  Respiratory system: Clear to auscultation. Respiratory effort normal. Cardiovascular system: Tachycardia. No JVD, murmurs, rubs, gallops or clicks. No pedal edema. Gastrointestinal system: Abdomen is nondistended, soft and some diffuse tenderness to palpation.  Positive bowel sounds.  No rebound.  No guarding. Central nervous system: Alert and oriented. No focal neurological deficits. Extremities: Symmetric 5 x 5 power. Skin: No rashes, lesions or ulcers Psychiatry: Judgement and insight appear normal. Mood & affect appropriate.     Data Reviewed: I have personally reviewed following labs and imaging studies  CBC: Recent Labs  Lab 05/21/24 2132 05/22/24 0427 05/23/24 0815  WBC 8.6 9.0 7.3  NEUTROABS  --   --  3.9  HGB 16.0* 12.9 13.3  HCT 50.0* 40.5 40.3  MCV 85.0 85.6 84.1  PLT 752* 509* 493*    Basic Metabolic Panel: Recent Labs  Lab 05/21/24 2132 05/22/24 0427 05/23/24 0815  NA 139 141 136  K 2.6* 3.2* 3.5  CL 87* 94* 98  CO2 30 33* 28  GLUCOSE 139* 106* 100*  BUN 16 12 <5*  CREATININE 0.66 0.66 0.57  CALCIUM  10.6* 9.0 9.0  MG  --  1.5* 2.0    GFR: CrCl cannot be calculated (Unknown ideal weight.).  Liver Function Tests: Recent Labs  Lab 05/21/24 2132  AST 22  ALT 7  ALKPHOS 96  BILITOT 0.7  PROT 9.4*  ALBUMIN 5.0    CBG: No results for input(s): GLUCAP in the last 168 hours.   Recent Results (from the past 240 hours)  Resp panel by RT-PCR (RSV, Flu A&B, Covid) Anterior Nasal Swab     Status: None   Collection Time: 05/22/24  6:03 AM   Specimen: Anterior Nasal Swab  Result Value Ref Range Status   SARS Coronavirus 2 by RT PCR NEGATIVE NEGATIVE Final    Comment: (NOTE) SARS-CoV-2 target nucleic acids are NOT DETECTED.  The SARS-CoV-2 RNA is generally detectable in upper respiratory specimens during the acute phase of infection. The  lowest concentration of SARS-CoV-2 viral copies this assay can detect is 138 copies/mL. A negative result does not preclude SARS-Cov-2 infection and should not be used as the sole basis for treatment or other patient management decisions. A negative result may occur with  improper specimen collection/handling, submission of specimen other than nasopharyngeal swab, presence of viral mutation(s) within the areas targeted by this assay, and inadequate number of viral copies(<138 copies/mL). A negative result must be combined with clinical observations, patient history, and epidemiological information. The expected result is Negative.  Fact Sheet for Patients:  bloggercourse.com  Fact Sheet for Healthcare Providers:  seriousbroker.it  This test is no t yet approved or cleared by the [place]  FDA and  has been authorized for detection and/or diagnosis of SARS-CoV-2 by FDA under an Emergency Use Authorization (EUA). This EUA will remain  in effect (meaning this test can be used) for the duration of the COVID-19 declaration under Section 564(b)(1) of the Act, 21 U.S.C.section 360bbb-3(b)(1), unless the authorization is terminated  or revoked sooner.  Influenza A by PCR NEGATIVE NEGATIVE Final   Influenza B by PCR NEGATIVE NEGATIVE Final    Comment: (NOTE) The Xpert Xpress SARS-CoV-2/FLU/RSV plus assay is intended as an aid in the diagnosis of influenza from Nasopharyngeal swab specimens and should not be used as a sole basis for treatment. Nasal washings and aspirates are unacceptable for Xpert Xpress SARS-CoV-2/FLU/RSV testing.  Fact Sheet for Patients: bloggercourse.com  Fact Sheet for Healthcare Providers: seriousbroker.it  This test is not yet approved or cleared by the [place]  FDA and has been authorized for detection and/or diagnosis of SARS-CoV-2 by FDA under  an Emergency Use Authorization (EUA). This EUA will remain in effect (meaning this test can be used) for the duration of the COVID-19 declaration under Section 564(b)(1) of the Act, 21 U.S.C. section 360bbb-3(b)(1), unless the authorization is terminated or revoked.     Resp Syncytial Virus by PCR NEGATIVE NEGATIVE Final    Comment: (NOTE) Fact Sheet for Patients: bloggercourse.com  Fact Sheet for Healthcare Providers: seriousbroker.it  This test is not yet approved or cleared by the [place]  FDA and has been authorized for detection and/or diagnosis of SARS-CoV-2 by FDA under an Emergency Use Authorization (EUA). This EUA will remain in effect (meaning this test can be used) for the duration of the COVID-19 declaration under Section 564(b)(1) of the Act, 21 U.S.C. section 360bbb-3(b)(1), unless the authorization is terminated or revoked.  Performed at [facility], [address] [place], [place] [address]          Radiology Studies: DG Abd 2 Views Result Date: 05/22/2024 EXAM: 2 VIEW XRAY OF THE ABDOMEN 05/22/2024 11:00:00 AM COMPARISON: None available. CLINICAL HISTORY: Nausea/vomiting. FINDINGS: LINES, TUBES AND DEVICES: Multiple overlying monitor wires over abdomen and pelvis. BOWEL: Nonobstructive bowel gas pattern. No signs of free air. SOFT TISSUES: No abnormal calcifications. BONES: No acute fracture. IMPRESSION: 1. No acute findings. Electronically signed by: Waddell Calk MD 05/22/2024 11:45 AM EST RP Workstation: [id number]        Scheduled Meds:  amitriptyline   50 mg Oral QHS   bisacodyl   10 mg Rectal Once   feeding supplement  1 Container Oral TID BM   pantoprazole  (PROTONIX ) IV  40 mg Intravenous Q24H   potassium chloride   40 mEq Oral Once   Continuous Infusions:     LOS: 1 day    Time spent: 35 minutes    Toribio Hummer, MD [facility]   To contact the  attending provider between 7A-7P or the covering provider during after hours 7P-7A, please log into the web site [email or web address] and access using universal [place] password for that web site. If you do not have the password, please call the hospital operator.  05/23/2024, 1:04 PM    "

## 2024-05-23 NOTE — Progress Notes (Signed)
 Per IV team at 2132 - according to the IV Nurse who assessed earlier, she can't find any suitable vein on the left arm , that means we can not even start a midline, if no veins suitable for regular PIV , midline requires bigger vein. that means, theres no vein for midline as well.  Andrez, NP aware. PO intake encouraged.
